# Patient Record
Sex: Female | Born: 2000 | Race: White | Hispanic: No | Marital: Single | Smoking: Former smoker
Health system: Southern US, Community
[De-identification: ages and names within clinical notes are randomized; demographics above are authoritative.]

## PROBLEM LIST (undated history)

## (undated) DIAGNOSIS — F640 Transsexualism: Secondary | ICD-10-CM

## (undated) DIAGNOSIS — F32A Depression, unspecified: Secondary | ICD-10-CM

## (undated) DIAGNOSIS — F909 Attention-deficit hyperactivity disorder, unspecified type: Secondary | ICD-10-CM

## (undated) DIAGNOSIS — E559 Vitamin D deficiency, unspecified: Secondary | ICD-10-CM

## (undated) DIAGNOSIS — F329 Major depressive disorder, single episode, unspecified: Secondary | ICD-10-CM

## (undated) DIAGNOSIS — Z79899 Other long term (current) drug therapy: Secondary | ICD-10-CM

## (undated) DIAGNOSIS — F4010 Social phobia, unspecified: Secondary | ICD-10-CM

## (undated) HISTORY — PX: TYMPANOSTOMY TUBE PLACEMENT: SHX32

---
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
## 2020-11-06 IMAGING — CR CHEST - 2 VIEW
1 series · 2 of 2 positions shown · non-contrast
Comparison: None.

CLINICAL DATA: Chest pain

EXAM:
CHEST - 2 VIEW

[Series 1: dg chest 2 view · 0.14mm/px · 2 of 2 slices shown]
[im 1/2]
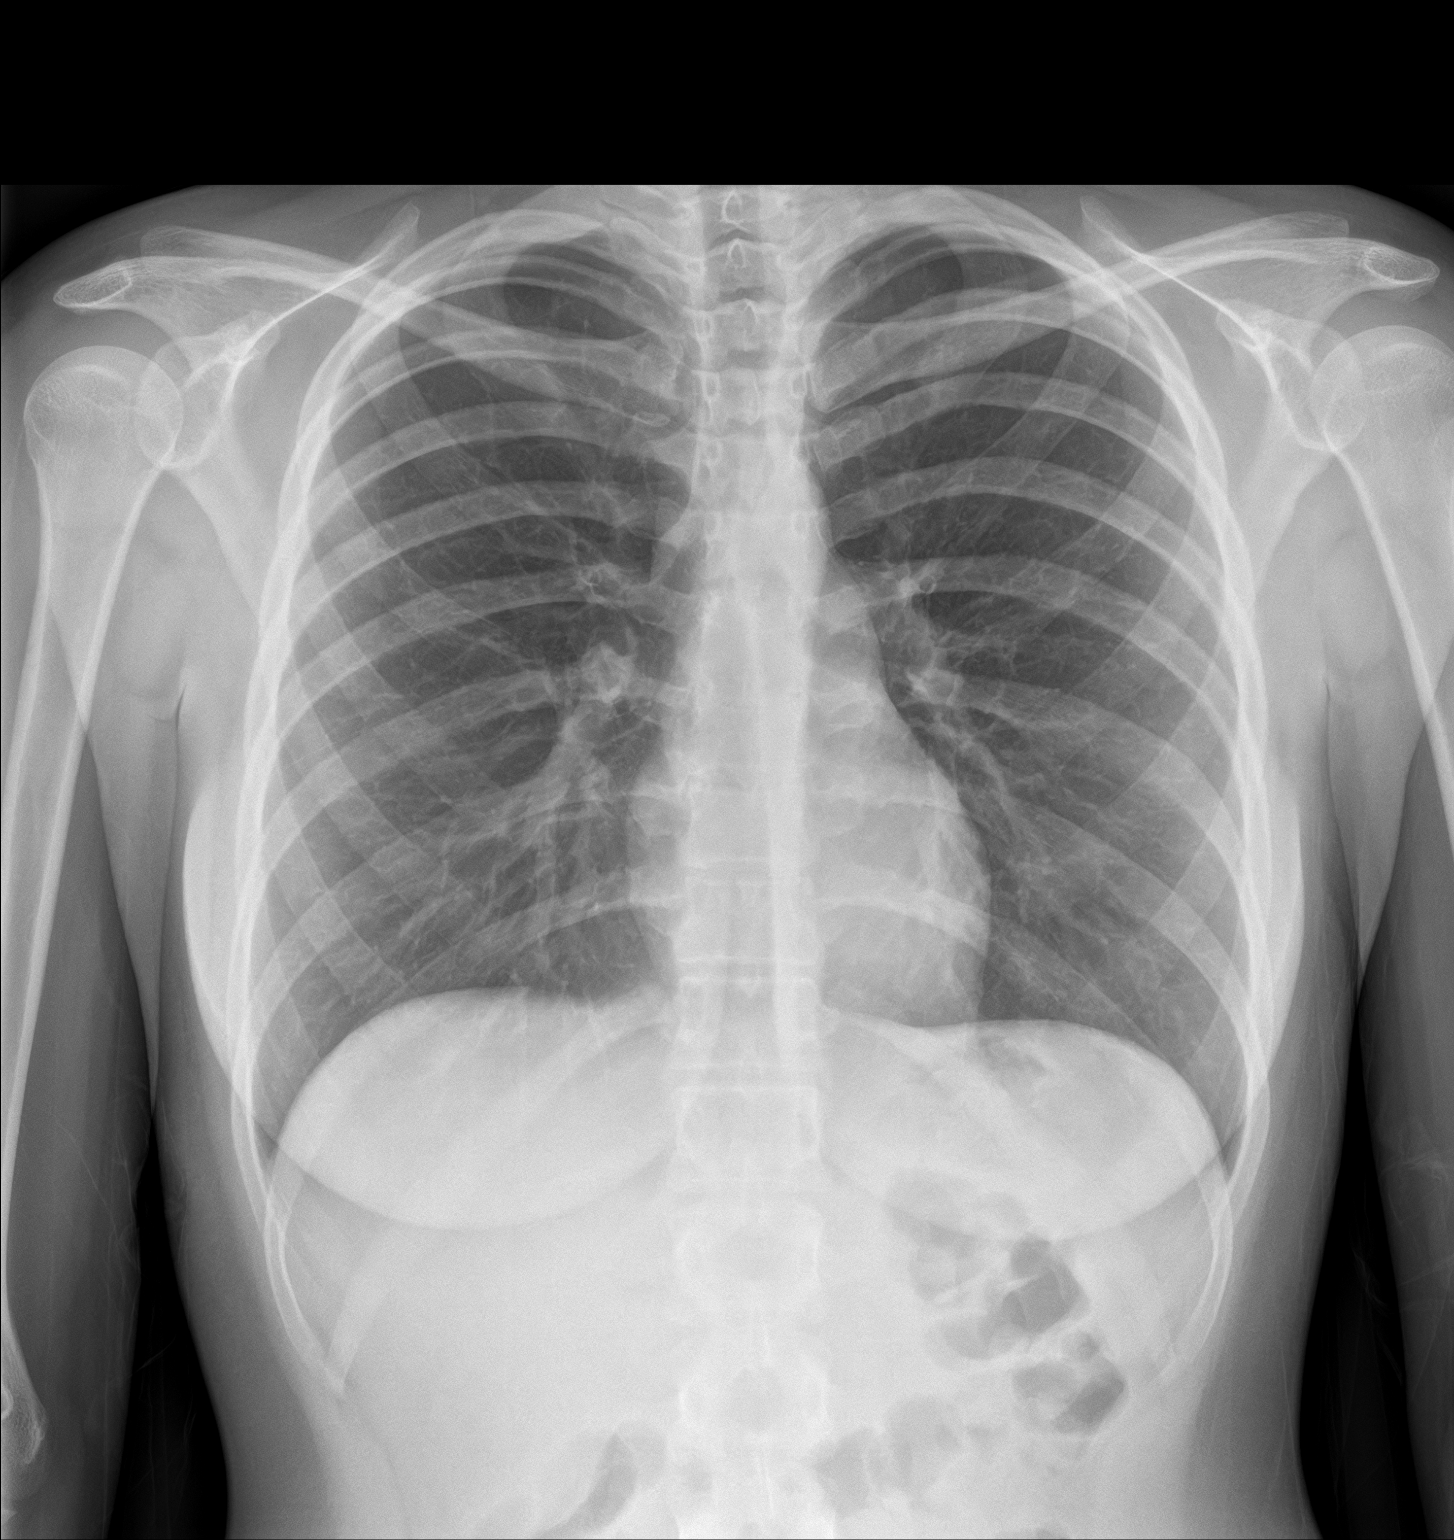
[im 2/2]
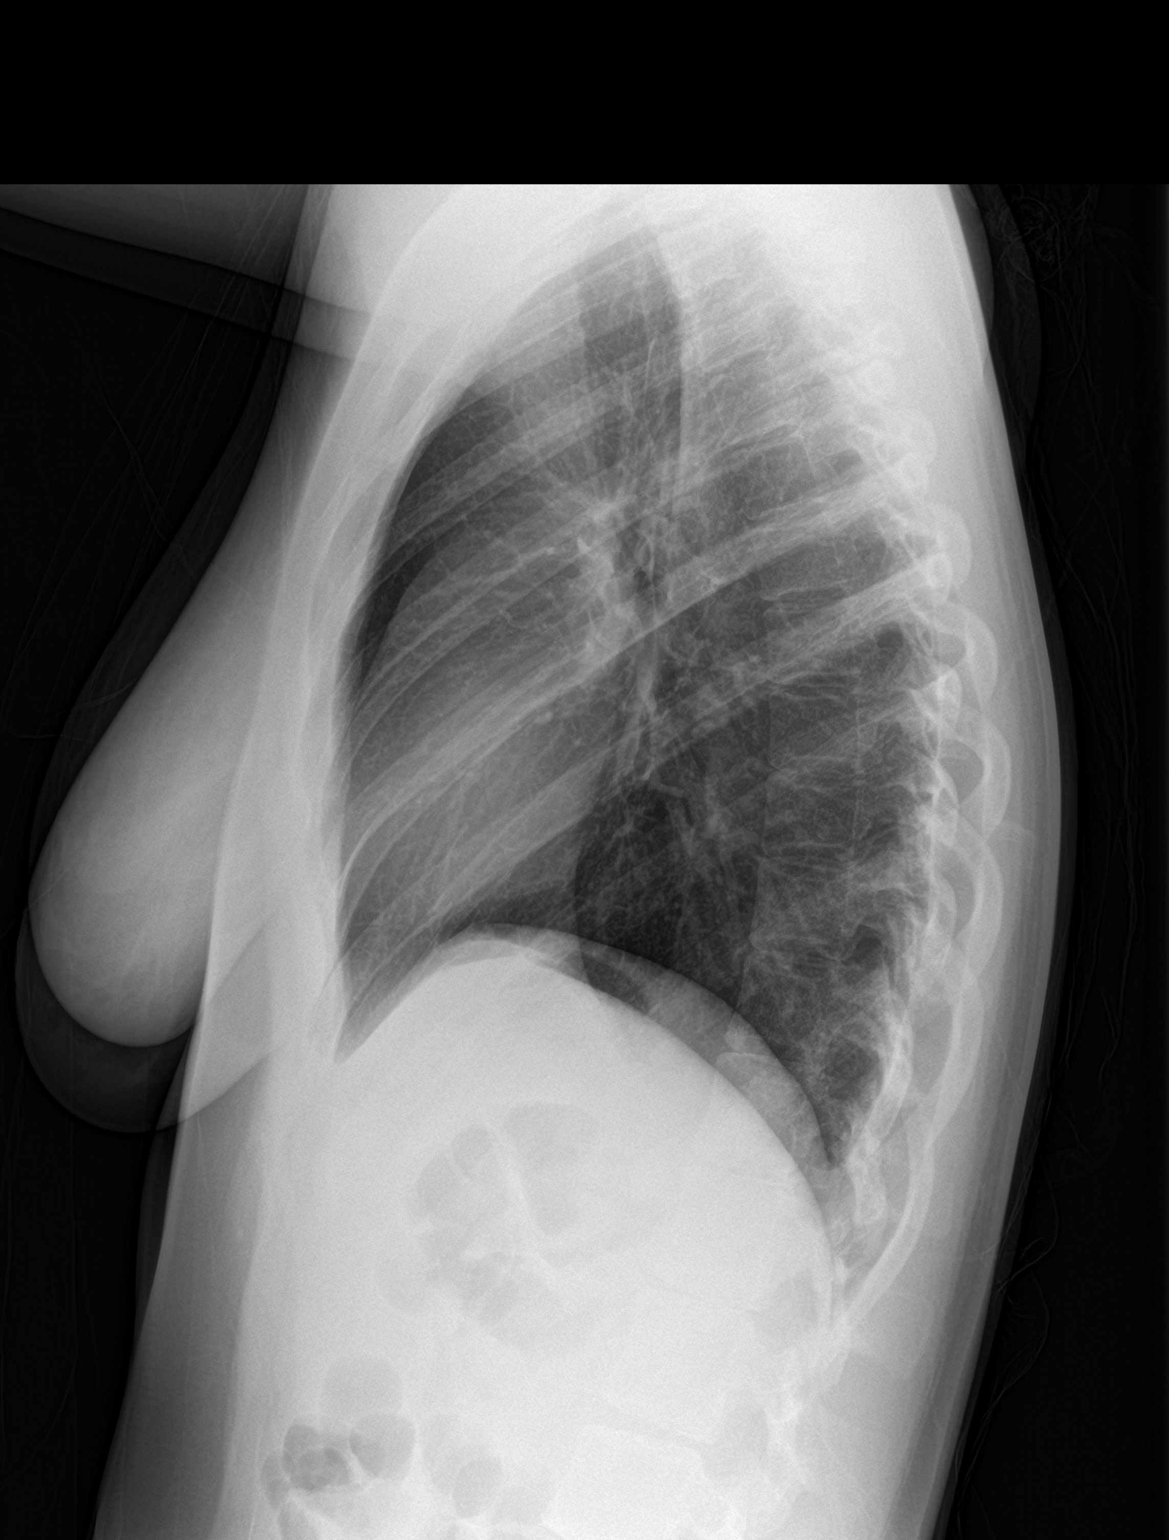

[2 of 2 positions shown; findings below may reference images not displayed]

FINDINGS: The heart size and mediastinal contours are within normal limits.
Both lungs are clear. The visualized skeletal structures are
unremarkable.
IMPRESSION: No acute abnormality of the lungs.  No airspace opacity.

## 2022-02-06 ENCOUNTER — Emergency Department
Admission: EM | Admit: 2022-02-06 | Discharge: 2022-02-06 | Disposition: A | Discharge status: Home / Self Care | Payer: Medicaid Other | Attending: Emergency Medicine | Admitting: Emergency Medicine

## 2022-02-06 ENCOUNTER — Other Ambulatory Visit: Payer: Self-pay

## 2022-02-06 DIAGNOSIS — Z760 Encounter for issue of repeat prescription: Secondary | ICD-10-CM

## 2022-02-06 MED ORDER — OLANZAPINE 5 MG PO TABS: 5.0000 mg | ORAL_TABLET | Freq: Every day | ORAL | 0 refills | Status: DC

## 2022-02-06 NOTE — Discharge Instructions (Addendum)
Your medications were refilled for you.  Please follow-up with your outpatient provider.  Please return for any new, worsening, or change in symptoms or other concerns.  It was a pleasure caring for you today.

## 2022-02-06 NOTE — ED Provider Notes (Signed)
[facility] Provider Note    Event Date/Time   First MD Initiated Contact with Patient 02/06/22 1131     (approximate)   History   Medication Refill   HPI  Christina Mcmillan is a 22 y.o. adult (pronouns he/his) with a past medical history of PTSD, ADHD, depression, social anxiety who presents today for evaluation and request for medication refill.  Patient reports that her last few appointments with [facility] has been canceled and therefore she has not been getting her medication refilled.  She reports that she takes Zyprexa 5 mg nightly, and last dose was last night.  She has not yet missed any doses.  She reports that she feels good on her current dosage.  She denies SI or HI.  She denies auditory or visual hallucinations.  She reports that she feels well-controlled.  She has no physical complaints today.  Patient Active Problem List   Diagnosis Date Noted   PTSD (post-traumatic stress disorder)    Attention deficit hyperactivity disorder (ADHD)    Severe recurrent major depression without psychotic features ([place]) 11/30/2017   MDD (major depressive disorder), severe ([place]) 11/30/2017   Drug overdose 11/29/2017   Hypotension 11/29/2017   Vitamin D deficiency 12/02/2015   Severe episode of recurrent major depressive disorder, without psychotic features ([place]) 11/26/2015   Social anxiety disorder 11/26/2015          Physical Exam   Triage Vital Signs: ED Triage Vitals  Enc Vitals Group     BP 02/06/22 1125 119/68     Pulse Rate 02/06/22 1125 (!) 103     Resp 02/06/22 1125 15     Temp 02/06/22 1125 98.3 F (36.8 C)     Temp Source 02/06/22 1125 Oral     SpO2 02/06/22 1125 98 %     Weight 02/06/22 1126 160 lb (72.6 kg)     Height 02/06/22 1126 5\' 3"  (1.6 m)     Head Circumference --      Peak Flow --      Pain Score 02/06/22 1125 0     Pain Loc --      Pain Edu? --      Excl. in [place]? --     Most recent vital signs: Vitals:   02/06/22 1125  BP: 119/68   Pulse: (!) 103  Resp: 15  Temp: 98.3 F (36.8 C)  SpO2: 98%    Physical Exam Vitals and nursing note reviewed.  Constitutional:      General: Awake and alert. No acute distress.    Appearance: Normal appearance. The patient is normal weight.  HENT:     Head: Normocephalic and atraumatic.     Mouth: Mucous membranes are moist.  Eyes:     General: PERRL. Normal EOMs        Right eye: No discharge.        Left eye: No discharge.     Conjunctiva/sclera: Conjunctivae normal.  Cardiovascular:     Rate and Rhythm: Normal rate and regular rhythm.     Pulses: Normal pulses.  Pulmonary:     Effort: Pulmonary effort is normal. No respiratory distress.     Breath sounds: Normal breath sounds.  Abdominal:     Abdomen is soft. There is no abdominal tenderness. No rebound or guarding. No distention. Musculoskeletal:        General: No swelling. Normal range of motion.     Cervical back: Normal range of motion and neck  supple.  Skin:    General: Skin is warm and dry.     Capillary Refill: Capillary refill takes less than 2 seconds.     Findings: No rash.  Neurological:     Mental Status: The patient is awake and alert.  Psych: Normal eye contact, normal affect, goal oriented, linear speech, does not appear to be responding to internal stimuli     ED Results / Procedures / Treatments   Labs (all labs ordered are listed, but only abnormal results are displayed) Labs Reviewed - No data to display   EKG     RADIOLOGY     PROCEDURES:  Critical Care performed:   Procedures   MEDICATIONS ORDERED IN ED: Medications - No data to display   IMPRESSION / MDM / [place] / ED COURSE  I reviewed the triage vital signs and the nursing notes.   Differential diagnosis includes, but is not limited to, medication refill, depression, anxiety.  I reviewed the patient's chart.  Patient had an office visit on 05/11/2021 for psychotherapy, and at that time she was also  taking Zyprexa.  Patient is awake and alert, hemodynamically stable and afebrile.  Medications were refilled.  He appears to be well stabilized on this medication and reports that he feels well on it.  His next appointment is 2/13.  Recommended close outpatient follow-up and return precautions.  Patient understands and agrees with plan.  Discharged in stable condition.   Patient's presentation is most consistent with acute complicated illness / injury requiring diagnostic workup.   FINAL CLINICAL IMPRESSION(S) / ED DIAGNOSES   Final diagnoses:  Medication refill     Rx / DC Orders   ED Discharge Orders          Ordered    OLANZapine (ZYPREXA) 5 MG tablet  Daily at bedtime        02/06/22 1141             Note:  This document was prepared using Dragon voice recognition software and may include unintentional dictation errors.   Emeline Gins 02/06/22 1214    Rada Hay, MD 02/06/22 [phone]

## 2022-02-06 NOTE — ED Triage Notes (Signed)
Pt to ED ED for med refill (olanzapine/zyprexa) states [facility] has canceled several appts and needs rx's refilled. Next appt at [facility] is 2/13 and ran out yesterday. Usually takes 5mg  ODT once daily. No acute distress, VSS. Pt is taking testosterone since 7/23, identifies as female and uses he/him pronouns. Registration notified to correct chart.

## 2022-02-20 ENCOUNTER — Other Ambulatory Visit: Payer: Self-pay

## 2022-02-20 ENCOUNTER — Emergency Department
Admission: EM | Admit: 2022-02-20 | Discharge: 2022-02-20 | Disposition: A | Discharge status: Home / Self Care | Payer: No Typology Code available for payment source | Attending: Emergency Medicine | Admitting: Emergency Medicine

## 2022-02-20 DIAGNOSIS — Z760 Encounter for issue of repeat prescription: Secondary | ICD-10-CM | POA: Insufficient documentation

## 2022-02-20 DIAGNOSIS — F32A Depression, unspecified: Secondary | ICD-10-CM | POA: Insufficient documentation

## 2022-02-20 MED ORDER — OLANZAPINE 5 MG PO TABS: 5.0000 mg | ORAL_TABLET | Freq: Every day | ORAL | 0 refills | Status: DC

## 2022-02-20 NOTE — ED Provider Notes (Signed)
[facility] Provider Note    Event Date/Time   First MD Initiated Contact with Patient 02/20/22 1052     (approximate)   History   Medication Refill   HPI  Christina Mcmillan is a 22 y.o. adult  past medical history of ADHD, PTSD, major depressive disorder who presents requesting medication refill.  Patient is currently out of her Zyprexa.  Says she takes this because of schizoaffective disorder.  She follows with [facility].  Took last dose last night.  Was seen in the ED on 1/27 and got a 2-week bridge but her appointment with [facility] is not until this Tuesday which is 3 days from now so she needs an extra 3 days.  Her [facility] provider will not give prescription prior to her seeing her.  Otherwise is feeling well without complaint.    Past Medical History:  Diagnosis Date   ADHD (attention deficit hyperactivity disorder)    Depression    Social anxiety disorder 11/26/2015   Vitamin D deficiency 12/02/2015    Patient Active Problem List   Diagnosis Date Noted   PTSD (post-traumatic stress disorder)    Attention deficit hyperactivity disorder (ADHD)    Severe recurrent major depression without psychotic features ([place]) 11/30/2017   MDD (major depressive disorder), severe ([place]) 11/30/2017   Drug overdose 11/29/2017   Hypotension 11/29/2017   Vitamin D deficiency 12/02/2015   Severe episode of recurrent major depressive disorder, without psychotic features ([place]) 11/26/2015   Social anxiety disorder 11/26/2015     Physical Exam  Triage Vital Signs: ED Triage Vitals  Enc Vitals Group     BP 02/20/22 1050 113/74     Pulse Rate 02/20/22 1050 95     Resp 02/20/22 1050 18     Temp 02/20/22 1050 98 F (36.7 C)     Temp Source 02/20/22 1050 Oral     SpO2 02/20/22 1050 96 %     Weight 02/20/22 1051 160 lb 0.9 oz (72.6 kg)     Height 02/20/22 1051 5' 3"$  (1.6 m)     Head Circumference --      Peak Flow --      Pain Score 02/20/22 1051 0     Pain Loc --      Pain Edu? --       Excl. in [place]? --     Most recent vital signs: Vitals:   02/20/22 1050  BP: 113/74  Pulse: 95  Resp: 18  Temp: 98 F (36.7 C)  SpO2: 96%     General: Awake, no distress.  CV:  Good peripheral perfusion.  Resp:  Normal effort.  Abd:  No distention.  Neuro:             Awake, Alert, Oriented x 3  Other:  Normal affect, calm and cooperative   ED Results / Procedures / Treatments  Labs (all labs ordered are listed, but only abnormal results are displayed) Labs Reviewed - No data to display   EKG     RADIOLOGY    PROCEDURES:  Critical Care performed: No  Procedures  The patient is on the cardiac monitor to evaluate for evidence of arrhythmia and/or significant heart rate changes.   MEDICATIONS ORDERED IN ED: Medications - No data to display   IMPRESSION / MDM / [place] / ED COURSE  I reviewed the triage vital signs and the nursing notes.  Patient's presentation is most consistent with acute, uncomplicated illness.  Patient presents requesting medication refill of the Zyprexa.  Takes this for depression/schizoaffective disorder.  Seen in the ED about 2 weeks ago got 2 weeks worth but does not see the [facility] until Tuesday which is 3 days from now on her [place] provider will not refill the prescription prior to this appointment.  Patient has no other complaints today.  Does not appear psychotic.  Last dose was last night.  I will refill 5 days worth to get her to this next appointment.       FINAL CLINICAL IMPRESSION(S) / ED DIAGNOSES   Final diagnoses:  Medication refill     Rx / DC Orders   ED Discharge Orders          Ordered    OLANZapine (ZYPREXA) 5 MG tablet  Daily at bedtime        02/20/22 1109             Note:  This document was prepared using Dragon voice recognition software and may include unintentional dictation errors.   Rada Hay, MD 02/20/22 [phone]

## 2022-02-20 NOTE — ED Triage Notes (Signed)
Pt is here for a medication refill, her Zyprexa and Olanzapine. Pt states she is being seen at [facility].

## 2022-04-25 ENCOUNTER — Other Ambulatory Visit: Payer: Self-pay

## 2022-04-25 ENCOUNTER — Emergency Department
Admission: EM | Admit: 2022-04-25 | Discharge: 2022-04-25 | Disposition: A | Discharge status: Home / Self Care | Payer: Medicaid Other | Attending: Emergency Medicine | Admitting: Emergency Medicine

## 2022-04-25 DIAGNOSIS — Z760 Encounter for issue of repeat prescription: Secondary | ICD-10-CM | POA: Diagnosis present

## 2022-04-25 MED ORDER — OLANZAPINE 5 MG PO TABS: 5.0000 mg | ORAL_TABLET | Freq: Every day | ORAL | 0 refills | Status: DC

## 2022-04-25 NOTE — Discharge Instructions (Addendum)
Please follow up with [facility] as scheduled. Return for new, worsening, or change in symptoms or other concerns.

## 2022-04-25 NOTE — ED Triage Notes (Signed)
Pt needs refill of Zyprexa. Ran out yesterday. Usually gets prescribed by [facility]

## 2022-04-25 NOTE — ED Provider Notes (Signed)
[facility] Provider Note    Event Date/Time   First MD Initiated Contact with Patient 04/25/22 1137     (approximate)   History   Medication Refill   HPI  Christina Mcmillan is a 22 y.o. adult who presents today for refill of Zyprexa.  Patient reports that he normally gets these filled at [facility], however they "never give me enough to get through until my next appointment."  He reports that his next appointment is this week.  He has not missed any doses. He take  QHS.  He reports that he feels well-controlled with medication.  He denies SI/HI/depression/auditory or visual hallucinations, or any other physical or psychiatric complaints today.  Patient Active Problem List   Diagnosis Date Noted   PTSD (post-traumatic stress disorder)    Attention deficit hyperactivity disorder (ADHD)    Severe recurrent major depression without psychotic features 11/30/2017   MDD (major depressive disorder), severe 11/30/2017   Drug overdose 11/29/2017   Hypotension 11/29/2017   Vitamin D deficiency 12/02/2015   Severe episode of recurrent major depressive disorder, without psychotic features 11/26/2015   Social anxiety disorder 11/26/2015          Physical Exam   Triage Vital Signs: ED Triage Vitals  Enc Vitals Group     BP 04/25/22 1139 117/75     Pulse Rate 04/25/22 1139 89     Resp 04/25/22 1139 17     Temp 04/25/22 1139 97.9 F (36.6 C)     Temp Source 04/25/22 1139 Oral     SpO2 04/25/22 1139 97 %     Weight 04/25/22 1133 158 lb 11.7 oz (72 kg)     Height 04/25/22 1133  (1.6 m)     Head Circumference --      Peak Flow --      Pain Score 04/25/22 1132 0     Pain Loc --      Pain Edu? --      Excl. in [place]? --     Most recent vital signs: Vitals:   04/25/22 1139  BP: 117/75  Pulse: 89  Resp: 17  Temp: 97.9 F (36.6 C)  SpO2: 97%    Physical Exam Vitals and nursing note reviewed.  Constitutional:      General: Awake and alert. No acute  distress.    Appearance: Normal appearance. The patient is normal weight.  HENT:     Head: Normocephalic and atraumatic.     Mouth: Mucous membranes are moist.  Eyes:     General: PERRL. Normal EOMs        Right eye: No discharge.        Left eye: No discharge.     Conjunctiva/sclera: Conjunctivae normal.  Cardiovascular:     Rate and Rhythm: Normal rate and regular rhythm.     Pulses: Normal pulses.  Pulmonary:     Effort: Pulmonary effort is normal. No respiratory distress.     Breath sounds: Normal breath sounds.  Abdominal:     Abdomen is soft. There is no abdominal tenderness. No rebound or guarding. No distention. Musculoskeletal:        General: No swelling. Normal range of motion.     Cervical back: Normal range of motion and neck supple.  Skin:    General: Skin is warm and dry.     Capillary Refill: Capillary refill takes less than 2 seconds.     Findings: No rash.  Neurological:  Mental Status: The patient is awake and alert.   Psych: Calm, cooperative, linear, goal oriented, normal eye contact, normal affect, does not appear to be responding to internal stimuli   ED Results / Procedures / Treatments   Labs (all labs ordered are listed, but only abnormal results are displayed) Labs Reviewed - No data to display   EKG     RADIOLOGY     PROCEDURES:  Critical Care performed:   Procedures   MEDICATIONS ORDERED IN ED: Medications - No data to display   IMPRESSION / MDM / ASSESSMENT AND PLAN / ED COURSE  I reviewed the triage vital signs and the nursing notes.   Differential diagnosis includes, but is not limited to, medication refill, schizoaffective disorder.  I reviewed the patient's chart.  Patient has been seen in this emergency department twice this year with the same complaint.    He reports that his next appointment is in 3 days.  Patient was given a weeks worth in case of delayed appointment.  He is awake and alert, nontoxic in  appearance, and has no other complaints today.  He has not missed any doses.  We discussed strict return precautions and the importance of close outpatient follow-up.  Patient understands and agrees with plan.  Discharged in stable condition.   Patient's presentation is most consistent with acute, uncomplicated illness.    FINAL CLINICAL IMPRESSION(S) / ED DIAGNOSES   Final diagnoses:  Medication refill     Rx / DC Orders   ED Discharge Orders          Ordered    OLANZapine (ZYPREXA) 5 MG tablet  Daily at bedtime        04/25/22 1142             Note:  This document was prepared using Dragon voice recognition software and may include unintentional dictation errors.   Keturah Shavers 04/25/22 1241    Jene Every, MD 04/25/22 1328

## 2022-05-16 ENCOUNTER — Other Ambulatory Visit: Payer: Self-pay

## 2022-05-16 ENCOUNTER — Emergency Department
Admission: EM | Admit: 2022-05-16 | Discharge: 2022-05-16 | Disposition: A | Discharge status: Home / Self Care | Payer: Medicaid Other | Attending: Emergency Medicine | Admitting: Emergency Medicine

## 2022-05-16 DIAGNOSIS — L03032 Cellulitis of left toe: Secondary | ICD-10-CM | POA: Insufficient documentation

## 2022-05-16 HISTORY — DX: Transsexualism: F64.0

## 2022-05-16 HISTORY — DX: Other long term (current) drug therapy: Z79.899

## 2022-05-16 MED ORDER — SULFAMETHOXAZOLE-TRIMETHOPRIM 800-160 MG PO TABS
1.0000 | ORAL_TABLET | Freq: Once | ORAL | Status: AC
  Administered 2022-05-16: 1 via ORAL
  Filled 2022-05-16: qty 1

## 2022-05-16 MED ORDER — SULFAMETHOXAZOLE-TRIMETHOPRIM 800-160 MG PO TABS: 1.0000 | ORAL_TABLET | Freq: Two times a day (BID) | ORAL | 0 refills | Status: DC

## 2022-05-16 NOTE — ED Provider Notes (Signed)
[facility] Provider Note  Patient Contact: 3:56 PM (approximate)   History   ingrown toenail infx   HPI  Christina Mcmillan is a 22 y.o. adult who presents the emergency department complaining of possible ingrown toenail and possible infection of the great toe left foot.  Patient has no     Physical Exam   Triage Vital Signs: ED Triage Vitals  Enc Vitals Group     BP 05/16/22 1251 121/67     Pulse Rate 05/16/22 1251 (!) 115     Resp 05/16/22 1251 16     Temp 05/16/22 1251 98 F (36.7 C)     Temp Source 05/16/22 1251 Oral     SpO2 05/16/22 1251 97 %     Weight --      Height --      Head Circumference --      Peak Flow --      Pain Score 05/16/22 1248 10     Pain Loc --      Pain Edu? --      Excl. in [place]? --     Most recent vital signs: Vitals:   05/16/22 1251  BP: 121/67  Pulse: (!) 115  Resp: 16  Temp: 98 F (36.7 C)  SpO2: 97%     General: Alert and in no acute distress. Cardiovascular:  Good peripheral perfusion Respiratory: Normal respiratory effort without tachypnea or retractions. Lungs CTAB.  Musculoskeletal: Full range of motion to all extremities.  Visualization of great toe left foot reveals erythema with draining purulence along the lateral nail edge. Neurologic:  No gross focal neurologic deficits are appreciated.  Skin:   No rash noted Other:   ED Results / Procedures / Treatments   Labs (all labs ordered are listed, but only abnormal results are displayed) Labs Reviewed - No data to display   EKG     RADIOLOGY    No results found.  PROCEDURES:  Critical Care performed: No  Procedures   MEDICATIONS ORDERED IN ED: Medications  sulfamethoxazole-trimethoprim (BACTRIM DS) 800-160 MG per tablet 1 tablet (has no administration in time range)     IMPRESSION / MDM / ASSESSMENT AND PLAN / ED COURSE  I reviewed the triage vital signs and the nursing notes.                                  Differential diagnosis includes, but is not limited to, paronychia, cellulitis, ingrown nail,  Patient's presentation is most consistent with acute presentation with potential threat to life or bodily function.   Patient's diagnosis is consistent with paronychia.  Patient presents emergency department complaining of pain and drainage from the left great toe.  No known injury.  Patient states that this has been developing over the last week to 2 weeks.  No other complaints.  Findings on exam are consistent with paronychia.  Antibiotics prescribed.  Follow-up with primary care or podiatry as needed.  Prescription for Bactrim.  Patient is given ED precautions to return to the ED for any worsening or new symptoms.     FINAL CLINICAL IMPRESSION(S) / ED DIAGNOSES   Final diagnoses:  Paronychia of great toe of left foot     Rx / DC Orders   ED Discharge Orders          Ordered    sulfamethoxazole-trimethoprim (BACTRIM DS) 800-160 MG tablet  2 times daily  05/16/22 1620             Note:  This document was prepared using Dragon voice recognition software and may include unintentional dictation errors.   Racheal Patches, PA-C 05/16/22 1627    Concha Se, MD 05/16/22 [phone]

## 2022-05-16 NOTE — ED Triage Notes (Signed)
Pt to Ed for possible infection to L great toenail area where pt has ingrown toenail. States blood and purulent drainage since 3 days. He/him pronouns.

## 2022-07-04 ENCOUNTER — Emergency Department
Admission: EM | Admit: 2022-07-04 | Discharge: 2022-07-04 | Disposition: A | Discharge status: Home / Self Care | Payer: Medicaid Other | Attending: Emergency Medicine | Admitting: Emergency Medicine

## 2022-07-04 ENCOUNTER — Other Ambulatory Visit: Payer: Self-pay

## 2022-07-04 DIAGNOSIS — L03031 Cellulitis of right toe: Secondary | ICD-10-CM | POA: Insufficient documentation

## 2022-07-04 DIAGNOSIS — M79674 Pain in right toe(s): Secondary | ICD-10-CM | POA: Diagnosis present

## 2022-07-04 DIAGNOSIS — L03039 Cellulitis of unspecified toe: Secondary | ICD-10-CM

## 2022-07-04 MED ORDER — SULFAMETHOXAZOLE-TRIMETHOPRIM 800-160 MG PO TABS: 1.0000 | ORAL_TABLET | Freq: Two times a day (BID) | ORAL | 0 refills | Status: DC

## 2022-07-04 MED ORDER — BACITRACIN ZINC 500 UNIT/GM EX OINT
TOPICAL_OINTMENT | Freq: Two times a day (BID) | CUTANEOUS | Status: DC
  Filled 2022-07-04: qty 0.9

## 2022-07-04 NOTE — ED Notes (Signed)
Patient declined discharge vital signs. 

## 2022-07-04 NOTE — ED Provider Notes (Signed)
[facility] Emergency Department Provider Note     Event Date/Time   First MD Initiated Contact with Patient 07/04/22 1133     (approximate)   History   Nail Problem   HPI  Christina Mcmillan is a 22 y.o. adult who presents to the ED for right great toe pain x 1 month.  Patient was diagnosed with a paronychia a month ago where he was treated with antibiotics. Symptoms resolved but have recently returned. Endorses white drainage and poor hygiene to toe. Pain score 0/10 an 7/10 with touch. Patient was instructed to follow up with podiatry but was unable to make an appointment. No injury or trauma.  Denies fever and numbness.    Physical Exam   Triage Vital Signs: ED Triage Vitals [07/04/22 1120]  Enc Vitals Group     BP 115/69     Pulse Rate (!) 121     Resp 18     Temp 98.3 F (36.8 C)     Temp src      SpO2 97 %     Weight 160 lb (72.6 kg)     Height 5\' 3"  (1.6 m)     Head Circumference      Peak Flow      Pain Score 0     Pain Loc      Pain Edu?      Excl. in [place]?     Most recent vital signs: Vitals:   07/04/22 1120  BP: 115/69  Pulse: (!) 121  Resp: 18  Temp: 98.3 F (36.8 C)  SpO2: 97%    General Awake, no distress.  Well-appearing. HEENT NCAT. PERRL. EOMI. No rhinorrhea. Mucous membranes are moist.  CV:  Good peripheral perfusion.  RESP:  Normal effort.  ABD:  No distention.  Other:  1 cm erythematous and edematous lesion on the lateral groove of right big toe.  Brown discharge in [place] upon removal.   ED Results / Procedures / Treatments   Labs (all labs ordered are listed, but only abnormal results are displayed) Labs Reviewed - No data to display  No results found.   PROCEDURES:  Critical Care performed: No  Procedures   MEDICATIONS ORDERED IN ED: Medications  bacitracin ointment ( Topical Given 07/04/22 1218)     IMPRESSION / MDM / ASSESSMENT AND PLAN / ED COURSE  I reviewed the triage vital signs and the  nursing notes.                               22 y.o. adult presents to the emergency department for evaluation and treatment of a skin lesion on the lateral groove of right big toe clinically consistent with paronychia. See HPI for further details.   Given presentation of lesion on lateral groove of big toe patient will be given antibiotics for infection.  I extensively cleaned and irrigated the lesion with chlorhexidine wash and saline, applied bacitracin ointment following a nonadherent gauze wrap for protection.  This was demonstrated for patient education and at home Patient is in satisfactory and stable condition for discharge and outpatient follow up.  We briefly discussed of at home wound care.  I encouraged frequent dressing changes throughout the day.  Patient will be discharged home with prescriptions for 10 day course of Bactrim. Patient is to follow up with podiatry in 2 days for further evaluation.  A list of local primary  providers is provided for patient to establish primary care. patient is given ED precautions to return to the ED for any worsening or new symptoms. Patient verbalizes understanding. All questions and concerns were addressed during ED visit.    Patient's presentation is most consistent with acute illness / injury with system symptoms.  FINAL CLINICAL IMPRESSION(S) / ED DIAGNOSES   Final diagnoses:  Paronychia of great toe     Rx / DC Orders   ED Discharge Orders          Ordered    sulfamethoxazole-trimethoprim (BACTRIM DS) 800-160 MG tablet  2 times daily,   Status:  Discontinued        07/04/22 1231    sulfamethoxazole-trimethoprim (BACTRIM DS) 800-160 MG tablet  2 times daily        07/04/22 1244             Note:  This document was prepared using Dragon voice recognition software and may include unintentional dictation errors.    Romeo Apple, Brandonlee Navis A, PA-C 07/04/22 1407    Georga Hacking, MD 07/04/22 1600

## 2022-07-04 NOTE — Discharge Instructions (Addendum)
Please go to the following website to schedule new (and existing) patient appointments:   [email or web address]   The following is a list of primary care offices in the area who are accepting new patients at this time.  Please reach out to one of them directly and let them know you would like to schedule an appointment to follow up on an Emergency Department visit, and/or to establish a new primary care provider (PCP).  There are likely other primary care clinics in the are who are accepting new patients, but this is an excellent place to start:  [facility] Lead physician: Dr Angela Bacigalupo [address] [place], [place] [address] [phone]  [facility] Lead Physician: Dr Krichna Sowles [address], , [place] [address] [phone]  [facility]  Lead Physician: Dr Megan Johnson [address], [place], [place] [address] [phone]  [facility] [place] [facility] Lead Physician: Dr Alex Karamalegos [address], [place], [place] [address] [phone]  [facility] [place] Lead Physician: Dr Laura Berglund [address], [place], [place] [address] [phone]   

## 2022-07-04 NOTE — ED Triage Notes (Signed)
Pt states coming in due to an infection on his big toe to the right foot. Pt states it first started a month ago, got better with antibiotics, but it has now gotten worse again.

## 2022-07-05 ENCOUNTER — Telehealth: Payer: Self-pay | Admitting: Podiatry

## 2022-07-05 NOTE — Telephone Encounter (Signed)
Called pt to see if they wanted to sched a f/u appt w/ [facility]. Pt stated that they would cb

## 2022-07-22 ENCOUNTER — Ambulatory Visit: Payer: MEDICAID | Admitting: Podiatry

## 2022-07-22 DIAGNOSIS — L600 Ingrowing nail: Secondary | ICD-10-CM

## 2022-07-22 NOTE — Progress Notes (Signed)
Subjective:  Patient ID: Christina Mcmillan, adult    DOB: Apr 30, 2000,  MRN: [id number]  Chief Complaint  Patient presents with   Ingrown Toenail    Bilateral hallux ingrown     22 y.o. adult presents with the above complaint.  Patient presents with left hallux lateral border ingrown painful to touch is progressive and worse hurts with ambulation worse with pressure pain scale 7 out of 10 dull achy in nature He would like to have it removed.  He has not seen anyone as prior to seeing me   Review of Systems: Negative except as noted in the HPI. Denies N/V/F/Ch.  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 Mix with 5 mL of Phenergan DM for swish and swallow., Disp: 100 mL, Rfl: 0   OLANZapine (ZYPREXA) 5 MG tablet, Take 1 tablet (5 mg total) by mouth at bedtime for 5 days., Disp: 5 tablet, Rfl: 0   OLANZapine (ZYPREXA) 5 MG tablet, Take 1 tablet (5 mg total) by mouth at bedtime for 7 days., Disp: 7 tablet, Rfl: 0   promethazine-dextromethorphan (PROMETHAZINE-DM) 6.25-15 MG/5ML syrup, Take 5 mLs by mouth 4 (four) times daily as needed for cough. Mix with 5 mL of viscous lidocaine for swish and swallow., Disp: 118 mL, Rfl: 0   sulfamethoxazole-trimethoprim (BACTRIM DS) 800-160 MG tablet, Take 1 tablet by mouth 2 (two) times daily., Disp: 20 tablet, Rfl: 0   Vitamin D, Ergocalciferol, (DRISDOL) 1.25 MG (50000 UT) CAPS capsule, , Disp: , Rfl:   Social History   Tobacco Use  Smoking Status Former  Smokeless Tobacco Former    Allergies   Allergen Reactions   Fruit & Vegetable Daily [Nutritional Supplements] Itching    Reports that she has an itching sensation when she eats "raw" fruits and vega tables. No severe reactions reported.   Objective:  There were no vitals filed for this visit. There is no height or weight on file to calculate BMI. Constitutional Well developed. Well nourished.  Vascular Dorsalis pedis pulses palpable bilaterally. Posterior tibial pulses palpable bilaterally. Capillary refill normal to all digits.  No cyanosis or clubbing noted. Pedal hair growth normal.  Neurologic Normal speech. Oriented to person, place, and time. Epicritic sensation to light touch grossly present bilaterally.  Dermatologic Painful ingrowing nail at lateral nail borders of the hallux nail left. No other open wounds. No skin lesions.  Orthopedic: Normal joint ROM without pain or crepitus bilaterally. No visible deformities. No bony tenderness.   Radiographs: None Assessment:   1. Ingrown left big toenail    Plan:  Patient was evaluated and treated and all questions answered.  Ingrown Nail, left -Patient elects to proceed with minor surgery to remove ingrown toenail removal today. Consent reviewed and signed by patient. -Ingrown nail excised. See procedure note. -Educated on post-procedure care including soaking. Written instructions provided and reviewed. -Patient to follow up in 2 weeks for nail check.  Procedure: Excision of Ingrown Toenail Location: Left 1st toe lateral nail borders. Anesthesia:  Lidocaine 1% plain; 1.5 mL and Marcaine 0.5% plain; 1.5 mL, digital block. Skin Prep: Betadine. Dressing: Silvadene; telfa; dry, sterile, compression dressing. Technique: Following skin prep, the toe was exsanguinated and a tourniquet was secured at the base of the toe. The affected nail border was freed, split with a nail splitter, and excised. Chemical matrixectomy was then performed with phenol and irrigated out  with alcohol. The tourniquet was then removed and sterile dressing applied. Disposition: Patient tolerated procedure well. Patient to return in 2 weeks for follow-up.   No follow-ups on file.

## 2022-07-22 NOTE — Patient Instructions (Signed)

## 2022-10-02 ENCOUNTER — Other Ambulatory Visit: Payer: Self-pay

## 2022-10-02 ENCOUNTER — Emergency Department
Admission: EM | Admit: 2022-10-02 | Discharge: 2022-10-02 | Disposition: A | Discharge status: Home / Self Care | Payer: MEDICAID | Attending: Emergency Medicine | Admitting: Emergency Medicine

## 2022-10-02 DIAGNOSIS — R300 Dysuria: Secondary | ICD-10-CM | POA: Diagnosis present

## 2022-10-02 DIAGNOSIS — N3090 Cystitis, unspecified without hematuria: Secondary | ICD-10-CM | POA: Insufficient documentation

## 2022-10-02 LAB — WET PREP, GENITAL
Clue Cells Wet Prep HPF POC: NONE SEEN
Sperm: NONE SEEN
Trich, Wet Prep: NONE SEEN
WBC, Wet Prep HPF POC: <10 (ref <10)
Yeast Wet Prep HPF POC: NONE SEEN

## 2022-10-02 LAB — URINALYSIS, ROUTINE W REFLEX MICROSCOPIC

## 2022-10-02 MED ORDER — IBUPROFEN 600 MG PO TABS: 600.0000 mg | ORAL_TABLET | Freq: Four times a day (QID) | ORAL | 0 refills | Status: DC | PRN

## 2022-10-02 NOTE — ED Provider Notes (Signed)
[facility] Provider Note    Event Date/Time   First MD Initiated Contact with Patient 10/02/22 1142     (approximate)   History   Flank Pain   HPI  LEMON SUPPA is a 22 y.o. adult   presents to the ED with urinary symptoms.  Patient reports dysuria and frequency for approximately 1 week but no nausea, vomiting or fever.  Patient also reports that she has had urinary symptoms for 6 years without a diagnosis.  Patient is currently taking cranberry tablets for her symptoms.      Physical Exam   Triage Vital Signs: ED Triage Vitals  Encounter Vitals Group     BP 10/02/22 1135 109/73     Systolic BP Percentile --      Diastolic BP Percentile --      Pulse Rate 10/02/22 1135 (!) 112     Resp 10/02/22 1135 18     Temp 10/02/22 1135 98.2 F (36.8 C)     Temp Source 10/02/22 1135 Oral     SpO2 10/02/22 1135 95 %     Weight 10/02/22 1136 150 lb (68 kg)     Height 10/02/22 1136 5\' 3"  (1.6 m)     Head Circumference --      Peak Flow --      Pain Score 10/02/22 1135 9     Pain Loc --      Pain Education --      Exclude from Growth Chart --     Most recent vital signs: Vitals:   10/02/22 1135 10/02/22 1300  BP: 109/73   Pulse: (!) 112   Resp: 18   Temp: 98.2 F (36.8 C) 98.4 F (36.9 C)  SpO2: 95%      General: Awake, no distress.  Alert. CV:  Good peripheral perfusion.  Resp:  Normal effort.  Clear bilaterally. Abd:  No distention.  No flank pain.  Other:     ED Results / Procedures / Treatments   Labs (all labs ordered are listed, but only abnormal results are displayed) Labs Reviewed  URINALYSIS, ROUTINE W REFLEX MICROSCOPIC - Abnormal; Notable for the following components:      Result Value   Color, Urine RED (*)    APPearance CLEAR (*)    Glucose, UA   (*)    Value: TEST NOT REPORTED DUE TO COLOR INTERFERENCE OF URINE PIGMENT   Hgb urine dipstick   (*)    Value: TEST NOT REPORTED DUE TO COLOR INTERFERENCE OF URINE PIGMENT    Bilirubin Urine   (*)    Value: TEST NOT REPORTED DUE TO COLOR INTERFERENCE OF URINE PIGMENT   Ketones, ur   (*)    Value: TEST NOT REPORTED DUE TO COLOR INTERFERENCE OF URINE PIGMENT   Protein, ur   (*)    Value: TEST NOT REPORTED DUE TO COLOR INTERFERENCE OF URINE PIGMENT   Nitrite   (*)    Value: TEST NOT REPORTED DUE TO COLOR INTERFERENCE OF URINE PIGMENT   Leukocytes,Ua   (*)    Value: TEST NOT REPORTED DUE TO COLOR INTERFERENCE OF URINE PIGMENT   Bacteria, UA RARE (*)    All other components within normal limits  WET PREP, GENITAL     PROCEDURES:  Critical Care performed:   Procedures   MEDICATIONS ORDERED IN ED: Medications - No data to display   IMPRESSION / MDM / ASSESSMENT AND PLAN / ED COURSE  I reviewed  the triage vital signs and the nursing notes.   Differential diagnosis includes, but is not limited to, cystitis, urinary tract infection, interstitial cystitis considered, urolithiasis, vaginitis bacterial versus yeast.  22 year old female presents to the ED with a 6-year history of dysuria for which there is not been a diagnosis made.  Patient has been taking cranberry tablets frequently for symptom relief.  Urinalysis showed rare bacteria and 6-10 WBCs.  Wet prep was negative for trichomonas, clue cells, yeast or WBCs.  I discussed referral to a urologist for her long-term urological symptoms which patient agrees to comply.  A prescription for ibuprofen was sent to the pharmacy and patient plans to continue with her cranberry tablets as needed.      Patient's presentation is most consistent with acute complicated illness / injury requiring diagnostic workup.  FINAL CLINICAL IMPRESSION(S) / ED DIAGNOSES   Final diagnoses:  Cystitis     Rx / DC Orders   ED Discharge Orders          Ordered    ibuprofen (ADVIL) 600 MG tablet  Every 6 hours PRN        10/02/22 1254             Note:  This document was prepared using Dragon voice recognition  software and may include unintentional dictation errors.   Tommi Rumps, PA-C 10/02/22 1407    Jene Every, MD 10/02/22 1435

## 2022-10-02 NOTE — Discharge Instructions (Addendum)
Call make an appointment with a urologist.  There are 2 offices listed on your discharge papers.  Ibuprofen was sent to the pharmacy to take as needed with food.  Increase fluids to stay hydrated.

## 2022-10-02 NOTE — ED Triage Notes (Signed)
Pt to ED c/o bilat flank pain x 1 day, reports UTI symptoms x 1 week, reports burning with urination, frequency. Reports hx of UTI, symptoms feel the same.

## 2022-10-02 NOTE — ED Notes (Signed)
Patient has specimen container and states after drinking some water, he will provide a specimen. Significant other at bedside.

## 2022-10-02 NOTE — ED Notes (Signed)
Pt states they are on their period at this time.

## 2022-10-13 ENCOUNTER — Encounter: Payer: Self-pay | Admitting: Obstetrics

## 2022-10-13 ENCOUNTER — Ambulatory Visit: Payer: MEDICAID | Admitting: Obstetrics

## 2022-10-13 ENCOUNTER — Other Ambulatory Visit (HOSPITAL_COMMUNITY)
Admission: RE | Admit: 2022-10-13 | Discharge: 2022-10-13 | Disposition: A | Discharge status: Home / Self Care | Payer: MEDICAID | Source: Ambulatory Visit | Attending: Obstetrics | Admitting: Obstetrics

## 2022-10-13 VITALS — BP 100/70 | Ht 63.0 in | Wt 149.0 lb

## 2022-10-13 DIAGNOSIS — R300 Dysuria: Secondary | ICD-10-CM

## 2022-10-13 DIAGNOSIS — N898 Other specified noninflammatory disorders of vagina: Secondary | ICD-10-CM | POA: Insufficient documentation

## 2022-10-13 DIAGNOSIS — Z113 Encounter for screening for infections with a predominantly sexual mode of transmission: Secondary | ICD-10-CM

## 2022-10-13 DIAGNOSIS — Z124 Encounter for screening for malignant neoplasm of cervix: Secondary | ICD-10-CM | POA: Insufficient documentation

## 2022-10-13 LAB — POCT URINALYSIS DIPSTICK
Bilirubin, UA: NEGATIVE
Glucose, UA: NEGATIVE
Ketones, UA: NEGATIVE
Nitrite, UA: NEGATIVE
Protein, UA: NEGATIVE
Spec Grav, UA: 1.01 (ref 1.010–1.025)
Urobilinogen, UA: 1 U/dL
pH, UA: 5.5 (ref 5.0–8.0)

## 2022-10-13 NOTE — Progress Notes (Signed)
    GYNECOLOGY PROGRESS NOTE  Subjective:    Patient ID: Christina Mcmillan, adult    DOB: 02-15-2000, 22 y.o.   MRN: [id number]  HPI  Patient is a 22 y.o. 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 He states h/o interstitial cystitis since 22yo and urine tests often come back "clean" but he continues to have dysuria. Pt thinks the opening of his vagina looks "puckered out" and is worried his uterus is "falling out." The itching has been since being off his testosterone medication for more than 2 mos. [facility], but hasn't been taking that for the past 2 mos either.   The following portions of the patient's history were reviewed and updated as appropriate: allergies, current medications, past family history, past medical history, past social history, past surgical history, and problem list.  Review of Systems Pertinent items are noted in HPI.   Objective:   Blood pressure 100/70, height 5\' 3"  (1.6 m), weight 149 lb (67.6 kg), last menstrual period 09/29/2022. Body mass index is 26.39 kg/m. General appearance: alert and cooperative Abdomen: soft, non-tender; bowel sounds normal; no masses,  no organomegaly Pelvic: cervix normal in appearance, external genitalia normal, no adnexal masses or tenderness, no cervical motion tenderness, uterus normal size, shape, and consistency, and vagina normal without discharge. Pap obtained.  Extremities: extremities normal, atraumatic, no cyanosis or edema Neurologic: Grossly normal  Assessment:   1. Dysuria   2. Vaginal itching   3. Screening examination for STI   4. Cervical cancer screening     Plan:  UA in office with +1 LE and trace BLD, sent for culture and shared decision made to defer treatment until culture results back. Pt also due for pap, no hx of prior, and requesting full STI screening today, will come back for blood draw. Reassurance given re: pt's anatomy, all completely within  normal variance and no evidence of any prolapse. All questions today answered. Follow up yearly for annual exam, sooner prn.   Julieanne Manson, DO [facility]

## 2022-10-15 LAB — URINE CULTURE

## 2022-10-15 NOTE — Progress Notes (Signed)
Please tell Joelene Millin his culture showed no UTI

## 2022-10-16 ENCOUNTER — Encounter: Payer: Self-pay | Admitting: Obstetrics

## 2022-10-18 ENCOUNTER — Other Ambulatory Visit: Payer: Self-pay

## 2022-10-18 DIAGNOSIS — B379 Candidiasis, unspecified: Secondary | ICD-10-CM

## 2022-10-18 LAB — CERVICOVAGINAL ANCILLARY ONLY
Bacterial Vaginitis (gardnerella): NEGATIVE
Candida Glabrata: NEGATIVE
Candida Vaginitis: POSITIVE — AB
Comment: NEGATIVE
Comment: NEGATIVE
Comment: NEGATIVE
Comment: NEGATIVE
Trichomonas: NEGATIVE

## 2022-10-18 MED ORDER — FLUCONAZOLE 150 MG PO TABS: 150.0000 mg | ORAL_TABLET | Freq: Every day | ORAL | 0 refills | Status: DC

## 2022-10-21 LAB — CYTOLOGY - PAP
Chlamydia: NEGATIVE
Comment: NEGATIVE
Comment: NEGATIVE
Comment: NORMAL
Diagnosis: UNDETERMINED — AB
High risk HPV: NEGATIVE
Neisseria Gonorrhea: NEGATIVE

## 2022-11-05 ENCOUNTER — Encounter: Payer: Self-pay | Admitting: Obstetrics

## 2022-11-05 ENCOUNTER — Ambulatory Visit (INDEPENDENT_AMBULATORY_CARE_PROVIDER_SITE_OTHER): Payer: MEDICAID | Admitting: Obstetrics

## 2022-11-05 VITALS — BP 100/70 | Ht 63.0 in | Wt 151.0 lb

## 2022-11-05 DIAGNOSIS — D241 Benign neoplasm of right breast: Secondary | ICD-10-CM | POA: Diagnosis not present

## 2022-11-05 NOTE — Progress Notes (Signed)
    GYNECOLOGY PROGRESS NOTE  Subjective:  PCP: Patient, No Pcp Per  Patient ID: Christina Mcmillan, adult    DOB: 03-Apr-2000, 22 y.o.   MRN: [id number]  HPI  Patient is a 22 y.o. G1P0010  trans-female who presents for right breast mass, does not hurt, thinks it has been there for awhile, but was only noticed yesterday. No FMH of breast cancer. About the size of a large grape, is under the nipple, but moves around. Pt wanted to have it checked out, has high anxiety. Denies nipple discharge, skin changes or retraction and no trauma to the area. Does drink a lot of caffeine.   The following portions of the patient's history were reviewed and updated as appropriate: allergies, current medications, past family history, past medical history, past social history, past surgical history, and problem list.  Review of Systems Pertinent items are noted in HPI.   Objective:   Blood pressure 100/70, height 5\' 3"  (1.6 m), weight 151 lb (68.5 kg). Body mass index is 26.75 kg/m.  General appearance: alert and cooperative Abdomen:  deferred Breast: R breast with a 1cm, nodular, firm, rubbery mobile mass under areola. Bilateral breasts with normal fibrocystic tissue and no other findings.  Pelvic: deferred   Assessment/Plan:   1. Fibroadenoma of breast, right   Reassurance and possible etiologies discussed. Handout in MyChart Symptomatic treatment for pain: Tylenol/Ibuprofen, ice, form-fitting sports bra, avoiding caffeine. Recommend [place] for diagnostic clarity if experiencing changes (new pain, sudden enlargement).  Follow up prn   Julieanne Manson, DO [facility]

## 2022-11-09 ENCOUNTER — Other Ambulatory Visit: Payer: Self-pay

## 2022-11-09 ENCOUNTER — Encounter: Payer: Self-pay | Admitting: *Deleted

## 2022-11-09 ENCOUNTER — Emergency Department
Admission: EM | Admit: 2022-11-09 | Discharge: 2022-11-09 | Disposition: A | Discharge status: Home / Self Care | Payer: MEDICAID | Attending: Emergency Medicine | Admitting: Emergency Medicine

## 2022-11-09 DIAGNOSIS — H66002 Acute suppurative otitis media without spontaneous rupture of ear drum, left ear: Secondary | ICD-10-CM | POA: Diagnosis not present

## 2022-11-09 DIAGNOSIS — H8112 Benign paroxysmal vertigo, left ear: Secondary | ICD-10-CM | POA: Insufficient documentation

## 2022-11-09 DIAGNOSIS — H9202 Otalgia, left ear: Secondary | ICD-10-CM | POA: Diagnosis present

## 2022-11-09 MED ORDER — MECLIZINE HCL 25 MG PO TABS
25.0000 mg | ORAL_TABLET | Freq: Once | ORAL | Status: AC
  Administered 2022-11-09: 25 mg via ORAL
  Filled 2022-11-09: qty 1

## 2022-11-09 MED ORDER — AMOXICILLIN-POT CLAVULANATE 875-125 MG PO TABS: 1.0000 | ORAL_TABLET | Freq: Two times a day (BID) | ORAL | 0 refills | Status: AC

## 2022-11-09 MED ORDER — AMOXICILLIN-POT CLAVULANATE 875-125 MG PO TABS
1.0000 | ORAL_TABLET | Freq: Once | ORAL | Status: AC
  Administered 2022-11-09: 1 via ORAL
  Filled 2022-11-09: qty 1

## 2022-11-09 MED ORDER — MECLIZINE HCL 25 MG PO TABS: 25.0000 mg | ORAL_TABLET | Freq: Three times a day (TID) | ORAL | 0 refills | Status: DC | PRN

## 2022-11-09 NOTE — ED Provider Notes (Signed)
[place] [facility]PARTMENT AT [facility] Provider Note   CSN: [id number] Arrival date & time: 11/09/22  1617     History  Chief Complaint  Patient presents with   Otalgia    Christina Mcmillan is a 22 y.o. adult.  Presents to the emergency department for evaluation of left ear pain for couple of weeks.  She is also had a few days of dizziness.  Has a history of vertigo.  She states when she moves her head while laying down in bed she notices dizziness and feels like the room spinning.  She has been having pressure and pain in the left ear for a few weeks.  She denies any cough congestion runny nose.  No vision changes, numbness tingling radicular symptoms.  No weakness in upper or lower extremities.  No significant dizziness with standing and ambulation only when she is laying down and moves her head to the left she notices the room is spinning.  She has not take any medications for vertigo. HPI     Home Medications Prior to Admission medications   Medication Sig Start Date End Date Taking? Authorizing Provider  amoxicillin-clavulanate (AUGMENTIN) 875-125 MG tablet Take 1 tablet by mouth 2 (two) times daily for 10 days. 11/09/22 11/19/22 Yes Evon Slack, PA-C  meclizine (ANTIVERT) 25 MG tablet Take 1 tablet (25 mg total) by mouth 3 (three) times daily as needed for dizziness. 11/09/22  Yes Evon Slack, PA-C  busPIRone (BUSPAR) 15 MG tablet Take 15 mg by mouth 2 (two) times daily. 10/19/22   [provider]  escitalopram (LEXAPRO) 20 MG tablet Take 20 mg by mouth daily.    [provider]  fluconazole (DIFLUCAN) 150 MG tablet Take 1 tablet (150 mg total) by mouth daily. Patient not taking: Reported on 11/05/2022 10/18/22   Julieanne Manson, MD  FLUoxetine (PROZAC) 40 MG capsule Take 1 capsule by mouth daily. 11/05/17   [provider]  hydrOXYzine (ATARAX/VISTARIL) 25 MG tablet Take 1 tablet (25 mg total) by mouth daily as needed. 12/05/17    Leata Mouse, MD  ibuprofen (ADVIL) 600 MG tablet Take 1 tablet (600 mg total) by mouth every 6 (six) hours as needed. 10/02/22   Tommi Rumps, PA-C  lamoTRIgine (LAMICTAL) 25 MG tablet Take by mouth. 10/18/22   [provider]  OLANZapine (ZYPREXA) 5 MG tablet Take 1 tablet (5 mg total) by mouth at bedtime for 5 days. 02/20/22 02/25/22  Georga Hacking, MD  OLANZapine (ZYPREXA) 5 MG tablet Take 1 tablet (5 mg total) by mouth at bedtime for 7 days. 04/25/22 05/02/22  Poggi, Herb Grays, PA-C  testosterone cypionate (DEPOTESTOSTERONE CYPIONATE) 200 MG/ML injection SMARTSIG:0.3 Milliliter(s) SUB-Q Once a Week    [provider]  Vitamin D, Ergocalciferol, (DRISDOL) 1.25 MG (50000 UT) CAPS capsule     [provider]      Allergies    Fruit & vegetable daily [nutritional supplements]    Review of Systems   Review of Systems  Physical Exam Updated Vital Signs BP 109/63 (BP Location: Left Arm)   Pulse 100   Temp 97.9 F (36.6 C) (Oral)   Resp 18   Ht 5\' 3"  (1.6 m)   Wt 68.5 kg   LMP 10/15/2022   SpO2 96%   BMI 26.75 kg/m  Physical Exam Constitutional:      Appearance: He is well-developed.  HENT:     Head: Normocephalic and atraumatic.     Right Ear:  Tympanic membrane, ear canal and external ear normal.     Ears:     Comments: Left TM bulging, erythematous with fluid present behind the TM.  TM is intact.  Canals normal.    Nose: Nose normal.  Eyes:     Conjunctiva/sclera: Conjunctivae normal.  Cardiovascular:     Rate and Rhythm: Normal rate.  Pulmonary:     Effort: Pulmonary effort is normal. No respiratory distress.  Musculoskeletal:        General: Normal range of motion.     Cervical back: Normal range of motion.  Skin:    General: Skin is warm.     Findings: No rash.  Neurological:     General: No focal deficit present.     Mental Status: He is alert and oriented to person, place, and time. Mental status is at baseline.      Cranial Nerves: No cranial nerve deficit.     Motor: No weakness.     Gait: Gait normal.     Comments: Positive Dix-Hallpike test.  Psychiatric:        Mood and Affect: Mood normal.        Behavior: Behavior normal.        Thought Content: Thought content normal.        Judgment: Judgment normal.     ED Results / Procedures / Treatments   Labs (all labs ordered are listed, but only abnormal results are displayed) Labs Reviewed - No data to display  EKG None  Radiology No results found.  Procedures Procedures    Medications Ordered in ED Medications  meclizine (ANTIVERT) tablet 25 mg (has no administration in time range)  amoxicillin-clavulanate (AUGMENTIN) 875-125 MG per tablet 1 tablet (has no administration in time range)    ED Course/ Medical Decision Making/ A&P                                 Medical Decision Making Risk Prescription drug management.   22 year old female with left ear pain, has signs of otitis media on exam.  She is started on Augmentin.  She also reports some vertigo symptoms that are mild for the last few days but seem to be dependent upon lying down supine and rotation of the head to the left.  She describes the room spinning.  No neurological deficits on exam.  Positive Dix-Hallpike test with nystagmus.  Patient given prescription for meclizine.  She is given strict return precautions.  She understands signs symptoms return to the ER for. Final Clinical Impression(s) / ED Diagnoses Final diagnoses:  Non-recurrent acute suppurative otitis media of left ear without spontaneous rupture of tympanic membrane  Benign paroxysmal positional vertigo of left ear    Rx / DC Orders ED Discharge Orders          Ordered    amoxicillin-clavulanate (AUGMENTIN) 875-125 MG tablet  2 times daily        11/09/22 1728    meclizine (ANTIVERT) 25 MG tablet  3 times daily PRN        11/09/22 1728              Evon Slack, PA-C 11/09/22 1732     Minna Antis, MD 11/09/22 2308

## 2022-11-09 NOTE — ED Triage Notes (Signed)
Pt has left ear ache.  No nasal congestion  no fever.  Pt alert

## 2022-11-09 NOTE — Discharge Instructions (Signed)
Please take medications as prescribed.  Make sure you are drinking lots of fluids.  Return to the ER for any fevers, increasing pain worsening symptoms or any urgent changes in your health

## 2022-11-22 ENCOUNTER — Other Ambulatory Visit (HOSPITAL_COMMUNITY)
Admission: RE | Admit: 2022-11-22 | Discharge: 2022-11-22 | Disposition: A | Discharge status: Home / Self Care | Payer: MEDICAID | Source: Ambulatory Visit | Attending: Obstetrics | Admitting: Obstetrics

## 2022-11-22 ENCOUNTER — Ambulatory Visit (INDEPENDENT_AMBULATORY_CARE_PROVIDER_SITE_OTHER): Payer: MEDICAID | Admitting: Obstetrics

## 2022-11-22 ENCOUNTER — Encounter: Payer: Self-pay | Admitting: Obstetrics

## 2022-11-22 VITALS — BP 100/65 | HR 93 | Ht 63.0 in | Wt 151.0 lb

## 2022-11-22 DIAGNOSIS — N898 Other specified noninflammatory disorders of vagina: Secondary | ICD-10-CM | POA: Insufficient documentation

## 2022-11-22 DIAGNOSIS — B379 Candidiasis, unspecified: Secondary | ICD-10-CM | POA: Diagnosis not present

## 2022-11-22 DIAGNOSIS — B3731 Acute candidiasis of vulva and vagina: Secondary | ICD-10-CM

## 2022-11-22 MED ORDER — FLUCONAZOLE 150 MG PO TABS: 150.0000 mg | ORAL_TABLET | ORAL | 0 refills | Status: DC

## 2022-11-22 NOTE — Progress Notes (Signed)
    GYNECOLOGY PROGRESS NOTE  Subjective:  PCP: Pcp, No  Patient ID: Christina Mcmillan, adult    DOB: 2000/03/17, 22 y.o.   MRN: [id number]  HPI  Patient is a 22 y.o. 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 Was recently on antibiotics for a ear infection, treated through the ED. After taking abx, noticed vaginal itching and white discharge. Has had yeast infections before and this is similar. No STI exposure.   The following portions of the patient's history were reviewed and updated as appropriate: allergies, current medications, past family history, past medical history, past social history, past surgical history, and problem list.  Review of Systems Pertinent items are noted in HPI.   Objective:   Blood pressure 100/65, pulse 93, height 5\' 3"  (1.6 m), weight 151 lb (68.5 kg), last menstrual period 10/15/2022. Body mass index is 26.75 kg/m.  General appearance: alert and cooperative Abdomen: soft, non-tender; bowel sounds normal; no masses,  no organomegaly Pelvic: cervix normal in appearance, external genitalia normal, no cervical motion tenderness, and vagina with thick white discharge, mildly erythematous and tender. Extremities: extremities normal, atraumatic, no cyanosis or edema Neurologic: Grossly normal  Assessment/Plan:   1. Vaginal candidiasis   2. Vaginal itching     Exam c/w yeast infection, Rx for Diflucan x 2.  If develops recurrent, pt may call and similar sx and will send in a one-time 150mg  dose.    Julieanne Manson, DO [facility]

## 2022-11-24 LAB — CERVICOVAGINAL ANCILLARY ONLY
Bacterial Vaginitis (gardnerella): NEGATIVE
Candida Glabrata: NEGATIVE
Candida Vaginitis: POSITIVE — AB
Comment: NEGATIVE
Comment: NEGATIVE
Comment: NEGATIVE

## 2022-12-07 ENCOUNTER — Ambulatory Visit: Payer: MEDICAID | Admitting: Obstetrics

## 2022-12-07 ENCOUNTER — Encounter: Payer: Self-pay | Admitting: Obstetrics

## 2022-12-07 VITALS — BP 100/60 | Ht 63.0 in | Wt 147.0 lb

## 2022-12-07 DIAGNOSIS — Z3202 Encounter for pregnancy test, result negative: Secondary | ICD-10-CM

## 2022-12-07 DIAGNOSIS — N751 Abscess of Bartholin's gland: Secondary | ICD-10-CM

## 2022-12-07 LAB — POCT URINE PREGNANCY: Preg Test, Ur: NEGATIVE

## 2022-12-07 MED ORDER — FLUCONAZOLE 150 MG PO TABS: 150.0000 mg | ORAL_TABLET | ORAL | 0 refills | Status: DC

## 2022-12-07 MED ORDER — SULFAMETHOXAZOLE-TRIMETHOPRIM 800-160 MG PO TABS: 1.0000 | ORAL_TABLET | Freq: Two times a day (BID) | ORAL | 1 refills | Status: DC

## 2022-12-07 NOTE — Progress Notes (Signed)
    GYNECOLOGY PROGRESS NOTE  Subjective:  PCP: Pcp, No  Patient ID: Christina Mcmillan, adult    DOB: Jul 08, 2000, 22 y.o.   MRN: [id number]  HPI  Patient is a 22 y.o. G1P0010 trans-female pt who presents for a cyst on his right labia. He noticed this 11/20, 7 days ago, it gradually became sore and is now improving. Denies drainage, has not taken anything for it. Googled and thinks he has a bartholin's cyst. No prior hx of this before. Feels like the cyst is gradually improving on it's own. Is sexually active with same female partner, no recent STI exposure, no vaginal discharge, burning, itching or odor.   The following portions of the patient's history were reviewed and updated as appropriate: allergies, current medications, past family history, past medical history, past social history, past surgical history, and problem list.  Review of Systems Pertinent items are noted in HPI.   Objective:   Blood pressure 100/60, height 5\' 3"  (1.6 m), weight 147 lb (66.7 kg), last menstrual period 12/05/2022. Body mass index is 26.04 kg/m.  General appearance: alert and cooperative Abdomen: soft, non-tender; bowel sounds normal; no masses,  no organomegaly Pelvic: right labia with a 1cm bartholin's cyst, palpable, mildly tender, no drainage.  Extremities: extremities normal, atraumatic, no cyanosis or edema Neurologic: Grossly normal   Assessment/Plan:   1. Abscess of right Bartholin gland   Discussed I&D vs antibiotics today, due to small size and symptoms self-resolving, feel is candidate for abx therapy. Pt opts for medication.  -Rx for Bactrim DS BID x 7d, dosing and side effects reviewed -Pt prone to yeast infections, sending in a course of Fluconazole for when he has finished the abx -Daily sitz baths and symptomatic treatment at home -Follow up if worsening. Over the holiday weekend, if not responding to abx and is worsening, should seek I&D through ED.    Julieanne Manson, DO [facility]

## 2022-12-13 ENCOUNTER — Encounter: Payer: Self-pay | Admitting: Obstetrics

## 2023-02-24 ENCOUNTER — Encounter: Payer: MEDICAID | Admitting: Podiatry

## 2023-02-24 NOTE — Progress Notes (Signed)
Patient did not show for his scheduled appointment this afternoon

## 2023-02-26 ENCOUNTER — Emergency Department
Admission: EM | Admit: 2023-02-26 | Discharge: 2023-02-26 | Disposition: A | Discharge status: Home / Self Care | Payer: MEDICAID | Attending: Emergency Medicine | Admitting: Emergency Medicine

## 2023-02-26 ENCOUNTER — Other Ambulatory Visit: Payer: Self-pay

## 2023-02-26 DIAGNOSIS — R0981 Nasal congestion: Secondary | ICD-10-CM | POA: Diagnosis present

## 2023-02-26 DIAGNOSIS — J069 Acute upper respiratory infection, unspecified: Secondary | ICD-10-CM | POA: Insufficient documentation

## 2023-02-26 LAB — RESP PANEL BY RT-PCR (RSV, FLU A&B, COVID)  RVPGX2
Influenza A by PCR: NEGATIVE
Influenza B by PCR: NEGATIVE
Resp Syncytial Virus by PCR: NEGATIVE
SARS Coronavirus 2 by RT PCR: NEGATIVE

## 2023-02-26 LAB — GROUP A STREP BY PCR: Group A Strep by PCR: NOT DETECTED

## 2023-02-26 NOTE — ED Triage Notes (Signed)
Pt to ED w partner, both being seen for nasal congestion and sore throat. Pt symptomatic since this morning. Pt in NAD. He/him pronouns.

## 2023-02-26 NOTE — ED Provider Notes (Signed)
   [facility] Provider Note    Event Date/Time   First MD Initiated Contact with Patient 02/26/23 1922     (approximate)   History   Nasal Congestion and Sore Throat   HPI  Christina Mcmillan is a 23 y.o. adult who presents for evaluation of nasal congestion and sore this morning.  Patient's partner has the same symptoms.      Physical Exam   Triage Vital Signs: ED Triage Vitals  Encounter Vitals Group     BP 02/26/23 1826 107/61     Systolic BP Percentile --      Diastolic BP Percentile --      Pulse Rate 02/26/23 1826 (!) 117     Resp 02/26/23 1826 18     Temp 02/26/23 1826 98.5 F (36.9 C)     Temp Source 02/26/23 1826 Oral     SpO2 02/26/23 1826 95 %     Weight 02/26/23 1828 155 lb (70.3 kg)     Height 02/26/23 1828 5\' 3"  (1.6 m)     Head Circumference --      Peak Flow --      Pain Score 02/26/23 1825 0     Pain Loc --      Pain Education --      Exclude from Growth Chart --     Most recent vital signs: Vitals:   02/26/23 1826  BP: 107/61  Pulse: (!) 117  Resp: 18  Temp: 98.5 F (36.9 C)  SpO2: 95%    General: Awake, no distress.  CV:  Good peripheral perfusion.  RRR. Resp:  Normal effort.  CTAB. Abd:  No distention.    ED Results / Procedures / Treatments   Labs (all labs ordered are listed, but only abnormal results are displayed) Labs Reviewed  GROUP A STREP BY PCR  RESP PANEL BY RT-PCR (RSV, FLU A&B, COVID)  [id number]    PROCEDURES:  Critical Care performed: No  Procedures   MEDICATIONS ORDERED IN ED: Medications - No data to display   IMPRESSION / MDM / ASSESSMENT AND PLAN / ED COURSE  I reviewed the triage vital signs and the nursing notes.                             23 year old who presents for evaluation of nasal congestion and sore throat.  Patient was tachycardic otherwise vital signs are stable.  Patient NAD and nontoxic-appearing on exam.  Differential diagnosis includes, but is not limited to, flu,  COVID, RSV, bronchitis, pneumonia, strep throat, viral URI.  Patient's presentation is most consistent with acute, uncomplicated illness.  Respiratory panel and strep test were both negative.  I discussed symptomatic management using Tylenol, ibuprofen and over-the-counter cold medications.  Encouraged drinking lots of fluids and rest.  They voiced understanding, all questions were answered and they were stable at discharge.     FINAL CLINICAL IMPRESSION(S) / ED DIAGNOSES   Final diagnoses:  Viral upper respiratory tract infection     Rx / DC Orders   ED Discharge Orders     None        Note:  This document was prepared using Dragon voice recognition software and may include unintentional dictation errors.   Cameron Ali, PA-C 02/26/23 2022    Trinna Post, MD 02/27/23 0003

## 2023-02-26 NOTE — Discharge Instructions (Signed)
You most likely have a viral upper respiratory infection.  This will resolve on its own with time.  You can take Tylenol and ibuprofen as needed for fever and bodyaches.  I also recommend over-the-counter cold medication.  Drink lots of fluids and get lots of rest.

## 2023-02-28 ENCOUNTER — Ambulatory Visit: Payer: MEDICAID | Admitting: Podiatry

## 2023-03-03 ENCOUNTER — Other Ambulatory Visit: Payer: Self-pay

## 2023-03-03 ENCOUNTER — Emergency Department
Admission: EM | Admit: 2023-03-03 | Discharge: 2023-03-03 | Disposition: A | Discharge status: Home / Self Care | Payer: MEDICAID | Attending: Emergency Medicine | Admitting: Emergency Medicine

## 2023-03-03 DIAGNOSIS — H9202 Otalgia, left ear: Secondary | ICD-10-CM | POA: Diagnosis present

## 2023-03-03 DIAGNOSIS — H6692 Otitis media, unspecified, left ear: Secondary | ICD-10-CM | POA: Insufficient documentation

## 2023-03-03 DIAGNOSIS — H6690 Otitis media, unspecified, unspecified ear: Secondary | ICD-10-CM

## 2023-03-03 MED ORDER — AMOXICILLIN-POT CLAVULANATE 875-125 MG PO TABS: 1.0000 | ORAL_TABLET | Freq: Two times a day (BID) | ORAL | 0 refills | Status: AC

## 2023-03-03 MED ORDER — IBUPROFEN 600 MG PO TABS
600.0000 mg | ORAL_TABLET | Freq: Once | ORAL | Status: AC
  Administered 2023-03-03: 600 mg via ORAL
  Filled 2023-03-03: qty 1

## 2023-03-03 NOTE — Discharge Instructions (Addendum)
You are seen in the emergency department and diagnosed with an ear infection in your left ear.  You are given a prescription for an antibiotic.  You can alternate Motrin and Tylenol.  You are given a referral for primary care physician, the number for primary care doctors and the number for the ear nose and throat specialist.  Pain control:  Ibuprofen (motrin/aleve/advil) - You can take 3 tablets (600 mg) every 6 hours as needed for pain/fever.  Acetaminophen (tylenol) - You can take 2 extra strength tablets (1000 mg) every 6 hours as needed for pain/fever.  You can alternate these medications or take them together.  Make sure you eat food/drink water when taking these medications.  Thank you for choosing [place] for your health care, it was my pleasure to care for you today!  Corena Herter, MD

## 2023-03-03 NOTE — ED Triage Notes (Signed)
Pt sts that he is here for ear fullness and pain. Pt sts that he was dx with the flu on 2/15 and since than the pain has gotten worse and he thinks that he might have an ear infection.

## 2023-03-03 NOTE — ED Provider Notes (Signed)
[facility] Provider Note    Event Date/Time   First MD Initiated Contact with Patient 03/03/23 1658     (approximate)   History   Ear Fullness   HPI  Christina Mcmillan is a 23 y.o. adult [place] [employer] with left ear pain.  States that she has been having left ear pain for the past day.  She was recently diagnosed with influenza on 2/15 and states that since that time she has had significantly worsening pain to her left ear.  Recent acute otitis media.  States that she has not been evaluated by ENT in the past.  Does not currently have a primary care provider.  Denies any shortness of breath, chest pain, cough, abdominal pain, nausea, vomiting or diarrhea.  Denies concern for pregnancy.   Physical Exam   Triage Vital Signs: ED Triage Vitals [03/03/23 1532]  Encounter Vitals Group     BP 106/77     Systolic BP Percentile      Diastolic BP Percentile      Pulse Rate (!) 114     Resp 17     Temp 98.5 F (36.9 C)     Temp Source Oral     SpO2 96 %     Weight 155 lb (70.3 kg)     Height      Head Circumference      Peak Flow      Pain Score 0     Pain Loc      Pain Education      Exclude from Growth Chart     Most recent vital signs: Vitals:   03/03/23 1532  BP: 106/77  Pulse: (!) 114  Resp: 17  Temp: 98.5 F (36.9 C)  SpO2: 96%    Physical Exam Constitutional:      Appearance: He is well-developed.  HENT:     Head: Atraumatic.     Right Ear: Tympanic membrane normal.     Left Ear: Swelling and tenderness present. A middle ear effusion is present. Tympanic membrane is injected. Tympanic membrane is not perforated.     Ears:     Comments: No tenderness to the mastoid.  No fullness or pain. Eyes:     Conjunctiva/sclera: Conjunctivae normal.  Cardiovascular:     Rate and Rhythm: Regular rhythm.  Pulmonary:     Effort: No respiratory distress.  Musculoskeletal:     Cervical back: Normal range of motion.  Skin:    General: Skin is  warm.  Neurological:     Mental Status: He is alert. Mental status is at baseline.      IMPRESSION / MDM / ASSESSMENT AND PLAN / ED COURSE  I reviewed the triage vital signs and the nursing notes.  Differential diagnosis including otitis externa, acute otitis media, viral illness including COVID/influenza   Labs (all labs ordered are listed, but only abnormal results are displayed) Labs interpreted as -    Labs Reviewed - No data to display  Lab work overall reassuring patient did have tachycardia on arrival.  On exam patient with findings concerning for left-sided acute otitis media likely secondary to her recent viral illness.  Will start the patient on Augmentin.  No signs of mastoiditis.  Given information to establish care with primary care physician, given recurrent nature given information to follow-up with the ear nose and throat specialist.  Given return precautions for any ongoing symptoms.     PROCEDURES:  Critical Care performed: No  Procedures  Patient's presentation is most consistent with acute complicated illness / injury requiring diagnostic workup.   MEDICATIONS ORDERED IN ED: Medications  ibuprofen (ADVIL) tablet 600 mg (600 mg Oral Given 03/03/23 1728)    FINAL CLINICAL IMPRESSION(S) / ED DIAGNOSES   Final diagnoses:  Recurrent AOM (acute otitis media)     Rx / DC Orders   ED Discharge Orders          Ordered    Ambulatory Referral to Primary Care (Establish Care)        03/03/23 1718    amoxicillin-clavulanate (AUGMENTIN) 875-125 MG tablet  2 times daily        03/03/23 1719             Note:  This document was prepared using Dragon voice recognition software and may include unintentional dictation errors.   Corena Herter, MD 03/03/23 2051

## 2023-03-14 ENCOUNTER — Ambulatory Visit (INDEPENDENT_AMBULATORY_CARE_PROVIDER_SITE_OTHER): Payer: MEDICAID | Admitting: Podiatry

## 2023-03-14 ENCOUNTER — Encounter: Payer: Self-pay | Admitting: Podiatry

## 2023-03-14 DIAGNOSIS — L600 Ingrowing nail: Secondary | ICD-10-CM | POA: Diagnosis not present

## 2023-03-14 MED ORDER — NEOMYCIN-POLYMYXIN-HC 3.5-10000-1 OT SUSP: OTIC | 0 refills | Status: DC

## 2023-03-14 NOTE — Patient Instructions (Signed)

## 2023-03-15 ENCOUNTER — Encounter: Payer: Self-pay | Admitting: Podiatry

## 2023-03-15 NOTE — Progress Notes (Signed)
  Subjective:  Patient ID: Christina Mcmillan, adult    DOB: [birth year],  MRN: [id number]  Chief Complaint  Patient presents with   Nail Problem    "I have a pretty bad ingrown toenail.  Dr. Allena Katz did the ingrown toenail before but it grew back."    23 y.o. adult presents with the above complaint. History confirmed with patient.  There has been complete regrowth of the nail and has been infected and bothering him for several weeks now  Objective:  Physical Exam: warm, good capillary refill, no trophic changes or ulcerative lesions, normal DP and PT pulses, normal sensory exam, and left great toenail ingrown with paronychia  Assessment:   1. Ingrown left big toenail      Plan:  Patient was evaluated and treated and all questions answered.     Ingrown Nail, left -Patient elects to proceed with minor surgery to remove ingrown toenail today. Consent reviewed and signed by patient. -Ingrown nail excised. See procedure note. -Educated on post-procedure care including soaking. Written instructions provided and reviewed. -Rx for Cortisporin sent to pharmacy. -Advised on signs and symptoms of infection developing.  We discussed that the phenol likely will create some redness and edema and tenderness around the nailbed as long as it is localized this is to be expected.  Will return as needed if any infection signs develop  Procedure: Excision of Ingrown Toenail Location: Left 1st toe lateral nail borders. Anesthesia: Lidocaine 1% plain; 1.5 mL and Marcaine 0.5% plain; 1.5 mL, digital block. Skin Prep: Betadine. Dressing: Silvadene; telfa; dry, sterile, compression dressing. Technique: Following skin prep, the toe was exsanguinated and a tourniquet was secured at the base of the toe. The affected nail border was freed, split with a nail splitter, and excised. Chemical matrixectomy was then performed with phenol and irrigated out with alcohol. The tourniquet was then removed and sterile dressing  applied. Disposition: Patient tolerated procedure well.    No follow-ups on file.

## 2023-03-21 ENCOUNTER — Other Ambulatory Visit: Payer: Self-pay

## 2023-03-21 ENCOUNTER — Emergency Department
Admission: EM | Admit: 2023-03-21 | Discharge: 2023-03-21 | Disposition: A | Discharge status: Home / Self Care | Payer: MEDICAID | Attending: Emergency Medicine | Admitting: Emergency Medicine

## 2023-03-21 DIAGNOSIS — L02411 Cutaneous abscess of right axilla: Secondary | ICD-10-CM | POA: Insufficient documentation

## 2023-03-21 DIAGNOSIS — M7989 Other specified soft tissue disorders: Secondary | ICD-10-CM | POA: Diagnosis present

## 2023-03-21 MED ORDER — HYDROCODONE-ACETAMINOPHEN 5-325 MG PO TABS
1.0000 | ORAL_TABLET | Freq: Once | ORAL | Status: AC
  Administered 2023-03-21: 1 via ORAL
  Filled 2023-03-21: qty 1

## 2023-03-21 MED ORDER — DOXYCYCLINE HYCLATE 100 MG PO TABS: 100.0000 mg | ORAL_TABLET | Freq: Two times a day (BID) | ORAL | 0 refills | Status: AC

## 2023-03-21 MED ORDER — LIDOCAINE HCL (PF) 1 % IJ SOLN
5.0000 mL | Freq: Once | INTRAMUSCULAR | Status: AC
  Administered 2023-03-21: 5 mL
  Filled 2023-03-21: qty 5

## 2023-03-21 NOTE — ED Notes (Signed)
 See triage note. Approx 2cm raised erythematous lesion in right axilla. Lesion appears to have come to a head but no drainage evident at this time. Pt denies any drainage. No previous Hx of similar symptoms. Pt took OTC ibuprofen this morning for pain.

## 2023-03-21 NOTE — ED Provider Triage Note (Signed)
 Emergency Medicine Provider Triage Evaluation Note  Christina Mcmillan , a 23 y.o. adult  was evaluated in triage.  Pt complains of an abscess to the R armpit. No fevers. No other complaint.  Review of Systems  Positive: Abscess to R armpit Negative: Fever, chills  Physical Exam  There were no vitals taken for this visit. Gen:   Awake, no distress   Resp:  Normal effort  MSK:   Moves extremities without difficulty  Other:    Medical Decision Making  Medically screening exam initiated at 5:06 PM.  Appropriate orders placed.  Christina Mcmillan was informed that the remainder of the evaluation will be completed by another provider, this initial triage assessment does not replace that evaluation, and the importance of remaining in the ED until their evaluation is complete.  No labs or imaging currently   Racheal Patches, PA-C 03/21/23 1709

## 2023-03-21 NOTE — Discharge Instructions (Addendum)
 You are seen in the ER today for evaluation of your armpit swelling.  You did have an abscess on exam that we drained.  I sent a prescription for antibiotics to your pharmacy.  Please take these as directed.  Follow with your primary care doctor for further evaluation.  Return to the ER for new or worsening symptoms.

## 2023-03-21 NOTE — ED Triage Notes (Signed)
 Pt comes with c/o 4-5 days of abscess noted to under right arm. Pt states it looks big and infected.

## 2023-03-21 NOTE — ED Provider Notes (Signed)
 [facility] Provider Note    Event Date/Time   First MD Initiated Contact with Patient 03/21/23 1946     (approximate)   History   Abscess   HPI  Christina Mcmillan is a 23 year old presenting to the emergency department for evaluation of armpit swelling.  About 4 days ago began to notice swelling in their axillary area with associated pain.  No history of similar.  No fevers.  Tried stabbing the area to open it, but no significant drainage.     Physical Exam   Triage Vital Signs: ED Triage Vitals  Encounter Vitals Group     BP 03/21/23 1708 114/66     Systolic BP Percentile --      Diastolic BP Percentile --      Pulse Rate 03/21/23 1708 93     Resp 03/21/23 1708 18     Temp 03/21/23 1708 98 F (36.7 C)     Temp src --      SpO2 03/21/23 1708 100 %     Weight 03/21/23 1707 155 lb (70.3 kg)     Height 03/21/23 1707 5\' 3"  (1.6 m)     Head Circumference --      Peak Flow --      Pain Score 03/21/23 1707 6     Pain Loc --      Pain Education --      Exclude from Growth Chart --     Most recent vital signs: Vitals:   03/21/23 1708  BP: 114/66  Pulse: 93  Resp: 18  Temp: 98 F (36.7 C)  SpO2: 100%     General: Awake, interactive  CV:  Regular rate, good peripheral perfusion.  Resp:  Unlabored respirations.  Abd:  Nondistended.  Neuro:  Symmetric facial movement, fluid speech MSK:  There is an area of fluctuant swelling over the right axillary region with overlying erythema.  Scattered additional small pustules noted without significant induration   ED Results / Procedures / Treatments   Labs (all labs ordered are listed, but only abnormal results are displayed) Labs Reviewed - No data to display   EKG EKG independently reviewed interpreted by myself (ER attending) demonstrates:    RADIOLOGY Imaging independently reviewed and interpreted by myself demonstrates:    PROCEDURES:  Critical Care performed: No  .Incision and  Drainage  Date/Time: 03/21/2023 9:06 PM  Performed by: Trinna Post, MD Authorized by: Trinna Post, MD   Consent:    Consent obtained:  Verbal   Consent given by:  Patient   Risks, benefits, and alternatives were discussed: yes   Location:    Type:  Abscess   Location:  Trunk   Trunk location: R axilla. Pre-procedure details:    Skin preparation:  Chlorhexidine Sedation:    Sedation type:  None Anesthesia:    Anesthesia method:  Local infiltration   Local anesthetic:  Lidocaine 1% w/o epi Procedure type:    Complexity:  Simple Procedure details:    Incision types:  Single straight   Wound management:  Probed and deloculated   Drainage:  Purulent   Wound treatment:  Wound left open Post-procedure details:    Procedure completion:  Tolerated    MEDICATIONS ORDERED IN ED: Medications  lidocaine (PF) (XYLOCAINE) 1 % injection 5 mL (has no administration in time range)     IMPRESSION / MDM / ASSESSMENT AND PLAN / ED COURSE  I reviewed the triage vital signs and the nursing notes.  Differential diagnosis includes, but is not limited to, abscess, cellulitis, lower suspicion lymph node given superficial and fluctuant nature  Patient's presentation is most consistent with acute, uncomplicated illness.  23 year old presenting to the emergency department with right axillary swelling and pain.  Exam consistent with abscess with overlying cellulitis.  I&D performed as above.  With scattered pustules in the area, will DC on antibiotics.  Strict return precautions provided.  Patient discharged stable condition.      FINAL CLINICAL IMPRESSION(S) / ED DIAGNOSES   Final diagnoses:  Abscess of axilla, right     Rx / DC Orders   ED Discharge Orders          Ordered    doxycycline (VIBRA-TABS) 100 MG tablet  2 times daily        03/21/23 2108             Note:  This document was prepared using Dragon voice recognition software and may include unintentional dictation  errors.   Trinna Post, MD 03/21/23 2108

## 2023-05-02 ENCOUNTER — Ambulatory Visit
Admission: EM | Admit: 2023-05-02 | Discharge: 2023-05-02 | Disposition: A | Discharge status: Home / Self Care | Payer: MEDICAID

## 2023-05-02 ENCOUNTER — Encounter: Payer: Self-pay | Admitting: Emergency Medicine

## 2023-05-02 DIAGNOSIS — L0201 Cutaneous abscess of face: Secondary | ICD-10-CM

## 2023-05-02 MED ORDER — MUPIROCIN 2 % EX OINT: 1.0000 | TOPICAL_OINTMENT | Freq: Three times a day (TID) | CUTANEOUS | 0 refills | Status: AC

## 2023-05-02 MED ORDER — DOXYCYCLINE HYCLATE 100 MG PO CAPS: 100.0000 mg | ORAL_CAPSULE | Freq: Two times a day (BID) | ORAL | 0 refills | Status: AC

## 2023-05-02 NOTE — ED Provider Notes (Signed)
 [facility]    CSN: [id number] Arrival date & time: 05/02/23  1152      History   Chief Complaint Chief Complaint  Patient presents with   Bump in Nostril     HPI Christina Mcmillan is a 23 y.o. adult transgender female.  Presents today for a 3 to 4-day history of an area of swelling, redness and pain inside the right nostril.  Patient says that he pushed on the area and some pus did come out.  Reports that he has been scratching inside his nose and his piercings have been scraping the inside of his nostril.  Works at [employer] and thinks he could have contaminated the area somehow that way.  Denies having any abscesses in his nose before and no known history of MRSA or staph infection but was seen in the ER about a month ago for abscess of the axilla which was treated with doxycycline  and got better.  No associated fever, congestion or other concerns.  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   MDD (major depressive disorder), severe (HCC) 11/30/2017   Drug overdose 11/29/2017   Hypotension 11/29/2017   Vitamin D  deficiency 12/02/2015   Severe episode of recurrent major depressive disorder, without psychotic features (HCC) 11/26/2015   Social anxiety disorder 11/26/2015    Past Surgical History:  Procedure Laterality Date   TYMPANOSTOMY TUBE PLACEMENT      OB History     Gravida  1   Para      Term      Preterm      AB  1   Living         SAB      IAB      Ectopic      Multiple      Live Births               Home Medications    Prior to  Admission medications   Medication Sig Start Date End Date Taking? Authorizing Provider  benztropine (COGENTIN) 0.5 MG tablet Take 0.5 mg by mouth daily. 01/13/23  Yes [provider]  busPIRone (BUSPAR) 15 MG tablet Take 15 mg by mouth 2 (two) times daily. 10/19/22   [provider]  doxycycline  (VIBRAMYCIN ) 100 MG capsule Take 1 capsule (100 mg total) by mouth 2 (two) times daily for 7 days. 05/02/23 05/09/23 Yes Nancy Axon B, PA-C  escitalopram (LEXAPRO) 20 MG tablet Take 20 mg by mouth daily.    [provider]  finasteride (PROPECIA) 1 MG tablet Take 1 mg by mouth daily. 11/19/22   [provider]  fluconazole  (DIFLUCAN ) 150 MG tablet Take 1 tablet (150 mg total) by mouth every 3 (three) days. 12/07/22   Sofia Dunn, MD  FLUoxetine  (PROZAC ) 40 MG capsule Take 1 capsule by mouth daily. 11/05/17   [provider]  hydrOXYzine  (ATARAX /VISTARIL ) 25 MG tablet Take 1 tablet (25 mg total) by mouth daily as needed. 12/05/17   Jonnalagadda, Janardhana, MD  ibuprofen  (ADVIL ) 600 MG tablet Take 1 tablet (600 mg total) by  mouth every 6 (six) hours as needed. 10/02/22   Stafford Eagles, PA-C  lamoTRIgine (LAMICTAL) 25 MG tablet Take by mouth. 10/18/22   [provider]  meclizine  (ANTIVERT ) 25 MG tablet Take 1 tablet (25 mg total) by mouth 3 (three) times daily as needed for dizziness. 11/09/22   Coralyn Derry, PA-C  mupirocin  ointment (BACTROBAN ) 2 % Apply 1 Application topically 3 (three) times daily for 7 days. 05/02/23 05/09/23 Yes Floydene Hy, PA-C  norethindrone (MICRONOR) 0.35 MG tablet Take 1 tablet by mouth daily.    [provider]  OLANZapine  (ZYPREXA ) 5 MG tablet Take 1 tablet (5 mg total) by mouth at bedtime for 5 days. 02/20/22 02/25/22  Heather Litter, MD  OLANZapine  (ZYPREXA ) 5 MG tablet Take 1 tablet (5 mg total) by mouth at bedtime for 7 days. 04/25/22 05/02/22  Poggi, Jenna E, PA-C  testosterone cypionate (DEPOTESTOSTERONE  CYPIONATE) 200 MG/ML injection SMARTSIG:0.3 Milliliter(s) SUB-Q Once a Week    [provider]  Vitamin D , Ergocalciferol , (DRISDOL ) 1.25 MG (50000 UT) CAPS capsule     [provider]    Family History Family History  Problem Relation Age of Onset   Diabetes Father    Diabetes Brother    Hypertension Other     Social History Social History   Tobacco Use   Smoking status: Former    Types: Cigarettes   Smokeless tobacco: Former   Tobacco comments:    Vape daily, no cigarettes. DJM 03/14/23  Vaping Use   Vaping status: Every Day  Substance Use Topics   Alcohol use: Not Currently    Comment: denies regular use but reports it the other night   Drug use: Not Currently    Types: Benzodiazepines    Comment: reports use of alcohol with xanax 1 time the other night     Allergies   Bactrim  [sulfamethoxazole -trimethoprim ], Gabapentin, and Fruit & vegetable daily [nutritional supplements]   Review of Systems Review of Systems  Constitutional:  Negative for fatigue and fever.  HENT:  Negative for congestion and rhinorrhea.        Nasal pain, swelling, redness inside nostril  Skin:  Positive for color change.  Neurological:  Negative for weakness and headaches.     Physical Exam Triage Vital Signs ED Triage Vitals  Encounter Vitals Group     BP      Systolic BP Percentile      Diastolic BP Percentile      Pulse      Resp      Temp      Temp src      SpO2      Weight      Height      Head Circumference      Peak Flow      Pain Score      Pain Loc      Pain Education      Exclude from Growth Chart    No data found.  Updated Vital Signs BP 114/76 (BP Location: Right Arm)   Pulse 97   Temp 98 F (36.7 C) (Oral)   Resp 18   SpO2 96%      Physical Exam Vitals and nursing note reviewed.  Constitutional:      General: He is not in acute distress.    Appearance: Normal appearance. He is not ill-appearing or toxic-appearing.  HENT:      Head: Normocephalic and atraumatic.     Nose: No  congestion or rhinorrhea.     Comments: Patient has numerous facial and nasal piercings. Has a large septum horseshoe gauge present. There is a moderate sized abscess inside the right nostril which is very tender to palpation. Scant yellowish drainage.  Eyes:     General: No scleral icterus.       Right eye: No discharge.        Left eye: No discharge.     Conjunctiva/sclera: Conjunctivae normal.  Cardiovascular:     Rate and Rhythm: Normal rate.  Pulmonary:     Effort: Pulmonary effort is normal. No respiratory distress.  Musculoskeletal:     Cervical back: Neck supple.  Skin:    General: Skin is dry.  Neurological:     General: No focal deficit present.     Mental Status: He is alert. Mental status is at baseline.     Motor: No weakness.     Gait: Gait normal.  Psychiatric:        Mood and Affect: Mood normal.        Behavior: Behavior normal.      UC Treatments / Results  Labs (all labs ordered are listed, but only abnormal results are displayed) Labs Reviewed - No data to display  EKG   Radiology No results found.  Procedures Procedures (including critical care time)  Medications Ordered in UC Medications - No data to display  Initial Impression / Assessment and Plan / UC Course  I have reviewed the triage vital signs and the nursing notes.  Pertinent labs & imaging results that were available during my care of the patient were reviewed by me and considered in my medical decision making (see chart for details).   23 year old transgender female presents for an area of redness, swelling and pain inside the right nostril for the past few days.  History of abscess treated in the ED last month.  Has numerous facial piercings and a very large horseshoe septum piercing present.  No new piercings and patient cleans jewelry regularly.  On exam has an abscess inside the right nostril.  Will treat at this time with doxycycline   and mupirocin  ointment.  Also discussed cleaning area with saline spray and monitoring closely.  Advised if no improvement in the next couple of days or symptoms worsen to go to emergency department or return here.  Discussed good hand hygiene, not picking at nose and cleaning jewelry regularly.   Final Clinical Impressions(s) / UC Diagnoses   Final diagnoses:  Abscess of face     Discharge Instructions      - Clean area with saline spray and try to leave the septum to jewelry out until this gets better. - Take full course of antibiotics and also use the antibiotic ointment applied to area with use of a Q-tip. -If fever, worsening of symptoms or no improvement in 2 to 3 days please return for reevaluation.  Please go to ER for sudden acute and severe worsening symptoms.     ED Prescriptions     Medication Sig Dispense Auth. Provider   doxycycline  (VIBRAMYCIN ) 100 MG capsule Take 1 capsule (100 mg total) by mouth 2 (two) times daily for 7 days. 14 capsule Nancy Axon B, PA-C   mupirocin  ointment (BACTROBAN ) 2 % Apply 1 Application topically 3 (three) times daily for 7 days. 22 g Floydene Hy, PA-C      PDMP not reviewed this encounter.   Floydene Hy, PA-C 05/02/23 1319

## 2023-05-02 NOTE — ED Triage Notes (Signed)
 Pt presents with a bump inside his right nostril for 3-4 days. He popped it and now it draining and swollen.

## 2023-05-02 NOTE — Discharge Instructions (Addendum)
-   Clean area with saline spray and try to leave the septum to jewelry out until this gets better. - Take full course of antibiotics and also use the antibiotic ointment applied to area with use of a Q-tip. -If fever, worsening of symptoms or no improvement in 2 to 3 days please return for reevaluation.  Please go to ER for sudden acute and severe worsening symptoms.

## 2023-05-16 ENCOUNTER — Ambulatory Visit: Payer: MEDICAID | Admitting: Internal Medicine

## 2023-05-16 ENCOUNTER — Telehealth: Payer: Self-pay

## 2023-05-16 NOTE — Telephone Encounter (Signed)
 Copied from CRM [id number]. Topic: General - Running Late >> May 16, 2023  2:14 PM Jeris Montes S wrote: Patient/patient representative is calling because they are running late for an appointment.

## 2023-07-25 ENCOUNTER — Other Ambulatory Visit: Payer: Self-pay

## 2023-07-25 ENCOUNTER — Ambulatory Visit: Payer: MEDICAID | Admitting: Internal Medicine

## 2023-07-25 ENCOUNTER — Encounter: Payer: Self-pay | Admitting: Internal Medicine

## 2023-07-25 VITALS — BP 100/68 | HR 97 | Temp 98.1°F | Resp 18 | Ht 63.0 in | Wt 144.1 lb

## 2023-07-25 DIAGNOSIS — Z23 Encounter for immunization: Secondary | ICD-10-CM | POA: Diagnosis not present

## 2023-07-25 DIAGNOSIS — E559 Vitamin D deficiency, unspecified: Secondary | ICD-10-CM

## 2023-07-25 DIAGNOSIS — Z1159 Encounter for screening for other viral diseases: Secondary | ICD-10-CM

## 2023-07-25 DIAGNOSIS — F640 Transsexualism: Secondary | ICD-10-CM | POA: Diagnosis not present

## 2023-07-25 DIAGNOSIS — F332 Major depressive disorder, recurrent severe without psychotic features: Secondary | ICD-10-CM | POA: Diagnosis not present

## 2023-07-25 DIAGNOSIS — R350 Frequency of micturition: Secondary | ICD-10-CM

## 2023-07-25 DIAGNOSIS — Z79899 Other long term (current) drug therapy: Secondary | ICD-10-CM | POA: Diagnosis not present

## 2023-07-25 DIAGNOSIS — Z3041 Encounter for surveillance of contraceptive pills: Secondary | ICD-10-CM | POA: Diagnosis not present

## 2023-07-25 DIAGNOSIS — Z114 Encounter for screening for human immunodeficiency virus [HIV]: Secondary | ICD-10-CM

## 2023-07-25 DIAGNOSIS — N3000 Acute cystitis without hematuria: Secondary | ICD-10-CM

## 2023-07-25 LAB — POCT URINALYSIS DIPSTICK
Bilirubin, UA: NEGATIVE
Glucose, UA: NEGATIVE
Ketones, UA: NEGATIVE
Nitrite, UA: POSITIVE
Protein, UA: POSITIVE — AB
Spec Grav, UA: 1.02 (ref 1.010–1.025)
Urobilinogen, UA: 0.2 U/dL
pH, UA: 5 (ref 5.0–8.0)

## 2023-07-25 MED ORDER — NORETHINDRONE 0.35 MG PO TABS: 1.0000 | ORAL_TABLET | Freq: Every day | ORAL | 3 refills | Status: DC

## 2023-07-25 MED ORDER — NITROFURANTOIN MONOHYD MACRO 100 MG PO CAPS: 100.0000 mg | ORAL_CAPSULE | Freq: Two times a day (BID) | ORAL | 0 refills | Status: AC
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
## 2023-07-27 LAB — URINE CULTURE
MICRO NUMBER:: 16695578
SPECIMEN QUALITY:: ADEQUATE

## 2023-07-27 LAB — COMPREHENSIVE METABOLIC PANEL WITH GFR
AG Ratio: 2.7 (calc) — ABNORMAL HIGH (ref 1.0–2.5)
ALT: 44 U/L — ABNORMAL HIGH (ref 6–29)
AST: 25 U/L (ref 10–30)
Albumin: 4.9 g/dL (ref 3.6–5.1)
Alkaline phosphatase (APISO): 66 U/L (ref 31–125)
BUN: 9 mg/dL (ref 7–25)
CO2: 28 mmol/L (ref 20–32)
Calcium: 9.4 mg/dL (ref 8.6–10.2)
Chloride: 104 mmol/L (ref 98–110)
Creat: 0.73 mg/dL (ref 0.50–0.96)
Globulin: 1.8 g/dL — ABNORMAL LOW (ref 1.9–3.7)
Glucose, Bld: 86 mg/dL (ref 65–99)
Potassium: 4.8 mmol/L (ref 3.5–5.3)
Sodium: 140 mmol/L (ref 135–146)
Total Bilirubin: 0.7 mg/dL (ref 0.2–1.2)
Total Protein: 6.7 g/dL (ref 6.1–8.1)
eGFR: 118 mL/min/1.73m2 (ref >=60)

## 2023-07-27 LAB — CBC WITH DIFFERENTIAL/PLATELET
Absolute Lymphocytes: 1875 {cells}/uL (ref 850–3900)
Absolute Monocytes: 378 {cells}/uL (ref 200–950)
Basophils Absolute: 30 {cells}/uL (ref 0–200)
Basophils Relative: 0.7 %
Eosinophils Absolute: 60 {cells}/uL (ref 15–500)
Eosinophils Relative: 1.4 %
HCT: 44.3 % (ref 35.0–45.0)
Hemoglobin: 14.8 g/dL (ref 11.7–15.5)
MCH: 30.1 pg (ref 27.0–33.0)
MCHC: 33.4 g/dL (ref 32.0–36.0)
MCV: 90 fL (ref 80.0–100.0)
MPV: 10.7 fL (ref 7.5–12.5)
Monocytes Relative: 8.8 %
Neutro Abs: 1957 {cells}/uL (ref 1500–7800)
Neutrophils Relative %: 45.5 %
Platelets: 268 Thousand/uL (ref 140–400)
RBC: 4.92 Million/uL (ref 3.80–5.10)
RDW: 11.3 % (ref 11.0–15.0)
Total Lymphocyte: 43.6 %
WBC: 4.3 Thousand/uL (ref 3.8–10.8)

## 2023-07-27 LAB — VITAMIN D 25 HYDROXY (VIT D DEFICIENCY, FRACTURES): Vit D, 25-Hydroxy: 28 ng/mL — ABNORMAL LOW (ref 30–100)

## 2023-07-27 LAB — HIV ANTIBODY (ROUTINE TESTING W REFLEX): HIV 1&2 Ab, 4th Generation: NONREACTIVE

## 2023-07-27 LAB — HEPATITIS C ANTIBODY: Hepatitis C Ab: NONREACTIVE

## 2023-07-28 ENCOUNTER — Ambulatory Visit: Payer: Self-pay | Admitting: Internal Medicine

## 2023-07-28 DIAGNOSIS — E559 Vitamin D deficiency, unspecified: Secondary | ICD-10-CM

## 2023-07-28 MED ORDER — VITAMIN D (ERGOCALCIFEROL) 1.25 MG (50000 UNIT) PO CAPS: 50000.0000 [IU] | ORAL_CAPSULE | ORAL | 0 refills | Status: DC

## 2023-07-29 ENCOUNTER — Ambulatory Visit: Payer: MEDICAID | Admitting: Internal Medicine

## 2023-08-24 ENCOUNTER — Ambulatory Visit: Payer: MEDICAID | Admitting: Internal Medicine

## 2023-08-24 ENCOUNTER — Other Ambulatory Visit: Payer: Self-pay

## 2023-08-24 ENCOUNTER — Encounter: Payer: Self-pay | Admitting: Internal Medicine

## 2023-08-24 VITALS — BP 112/68 | HR 102 | Temp 97.8°F | Resp 16 | Ht 63.0 in | Wt 141.1 lb

## 2023-08-24 DIAGNOSIS — E559 Vitamin D deficiency, unspecified: Secondary | ICD-10-CM

## 2023-08-24 DIAGNOSIS — Z3041 Encounter for surveillance of contraceptive pills: Secondary | ICD-10-CM | POA: Diagnosis not present

## 2023-08-24 MED ORDER — VITAMIN D (ERGOCALCIFEROL) 1.25 MG (50000 UNIT) PO CAPS: 50000.0000 [IU] | ORAL_CAPSULE | ORAL | 0 refills | Status: AC

## 2023-08-24 MED ORDER — NORETHINDRONE 0.35 MG PO TABS: 1.0000 | ORAL_TABLET | Freq: Every day | ORAL | 3 refills | Status: AC

## 2023-08-24 NOTE — Patient Instructions (Signed)
 It was great seeing you today!  Plan discussed at today's visit: -Recommend Zyrtec and Astelin (Astropro) nasal spray over the counter for allergies   Follow up in: 1 year  Take care and let us  know if you have any questions or concerns prior to your next visit.  Dr. Bernardo
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
## 2023-08-30 ENCOUNTER — Ambulatory Visit (INDEPENDENT_AMBULATORY_CARE_PROVIDER_SITE_OTHER): Payer: MEDICAID

## 2023-08-30 DIAGNOSIS — Z23 Encounter for immunization: Secondary | ICD-10-CM | POA: Diagnosis not present

## 2023-08-30 NOTE — Progress Notes (Signed)
 Patient is in office today for a nurse visit for HPV Immunization. Patient Injection was given in the  Left deltoid. Patient tolerated injection well.

## 2023-09-13 ENCOUNTER — Other Ambulatory Visit: Payer: Self-pay

## 2023-09-13 ENCOUNTER — Emergency Department
Admission: EM | Admit: 2023-09-13 | Discharge: 2023-09-13 | Disposition: A | Discharge status: Home / Self Care | Payer: MEDICAID | Attending: Emergency Medicine | Admitting: Emergency Medicine

## 2023-09-13 DIAGNOSIS — L03011 Cellulitis of right finger: Secondary | ICD-10-CM | POA: Diagnosis not present

## 2023-09-13 DIAGNOSIS — M79644 Pain in right finger(s): Secondary | ICD-10-CM | POA: Diagnosis present

## 2023-09-13 NOTE — ED Provider Notes (Signed)
 [facility] Provider Note    Event Date/Time   First MD Initiated Contact with Patient 09/13/23 0932     (approximate)   History   Finger Injury   HPI  Christina Mcmillan is a 23 y.o. adult who presents today for evaluation of right fourth finger pain.  Patient reports that patient bites her nails and feels like it might be infected.  No numbness or tingling.  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/30/2017   MDD (major depressive disorder), severe (HCC) 11/30/2017   Drug overdose 11/29/2017   Hypotension 11/29/2017   Vitamin D  deficiency 12/02/2015   Severe episode of recurrent major depressive disorder, without psychotic features (HCC) 11/26/2015   Social anxiety disorder 11/26/2015          Physical Exam   Triage Vital Signs: ED Triage Vitals  Encounter Vitals Group     BP 09/13/23 0929 108/76     Girls Systolic BP Percentile --      Girls Diastolic BP Percentile --      Boys Systolic BP Percentile --      Boys Diastolic BP Percentile --      Pulse Rate 09/13/23 0929 (!) 101     Resp 09/13/23 0929 18     Temp 09/13/23 0929 97.6 F (36.4 C)     Temp src --      SpO2 09/13/23 0929 100 %     Weight 09/13/23 0927 150 lb (68 kg)     Height 09/13/23 0927 5' 2 (1.575 m)     Head Circumference --      Peak Flow --      Pain Score 09/13/23 0927 8     Pain Loc --      Pain Education --      Exclude from Growth Chart --     Most recent vital signs: Vitals:   09/13/23 0929  BP: 108/76  Pulse: (!) 101  Resp: 18  Temp: 97.6 F (36.4 C)  SpO2: 100%    Physical Exam Vitals and nursing note reviewed.  Constitutional:      General: Awake and alert. No acute distress.    Appearance: Normal appearance. The patient is normal weight.  HENT:     Head: Normocephalic and  atraumatic.     Mouth: Mucous membranes are moist.  Eyes:     General: PERRL. Normal EOMs        Right eye: No discharge.        Left eye: No discharge.     Conjunctiva/sclera: Conjunctivae normal.  Cardiovascular:     Rate and Rhythm: Normal rate and regular rhythm.     Pulses: Normal pulses.  Pulmonary:     Effort: Pulmonary effort is normal. No respiratory distress.     Breath sounds: Normal breath sounds.  Abdominal:     Abdomen is soft. There is no abdominal tenderness. No rebound or guarding. No distention. Musculoskeletal:        General: No swelling. Normal range of motion.     Cervical back: Normal range of motion and neck supple.  Right fourth finger with erythema, swelling, and tenderness along the nail line.  No tenderness to pad of finger.  Able to flex and extend at isolated PIP and DIP against resistance.  No subungual hematoma  or abscess. Skin:    General: Skin is warm and dry.     Capillary Refill: Capillary refill takes less than 2 seconds.     Findings: No rash.  Neurological:     Mental Status: The patient is awake and alert.      ED Results / Procedures / Treatments   Labs (all labs ordered are listed, but only abnormal results are displayed) Labs Reviewed - No data to display   EKG     RADIOLOGY     PROCEDURES:  Critical Care performed:   .Incision and Drainage  Date/Time: 09/13/2023 9:39 AM  Performed by: Dedra Matsuo E, PA-C Authorized by: Jamone Garrido E, PA-C   Consent:    Consent obtained:  Verbal   Consent given by:  Patient   Risks, benefits, and alternatives were discussed: yes     Risks discussed:  Bleeding, damage to other organs, incomplete drainage, infection and pain   Alternatives discussed:  No treatment Universal protocol:    Procedure explained and questions answered to patient or proxy's satisfaction: yes     Relevant documents present and verified: yes     Test results available : yes     Required blood products,  implants, devices, and special equipment available: yes     Site/side marked: yes     Patient identity confirmed:  Verbally with patient Location:    Type:  Abscess   Location:  Upper extremity   Upper extremity location:  Finger   Finger location:  R ring finger Pre-procedure details:    Skin preparation:  Antiseptic wash Sedation:    Sedation type:  None Anesthesia:    Anesthesia method:  None Procedure type:    Complexity:  Simple Procedure details:    Ultrasound guidance: no     Needle aspiration: no     Incision types:  Stab incision (Lifted cuticle)   Incision depth:  Dermal   Wound management:  Probed and deloculated   Drainage:  Purulent   Drainage amount:  Scant   Wound treatment:  Wound left open   Packing materials:  None Post-procedure details:    Procedure completion:  Tolerated well, no immediate complications    MEDICATIONS ORDERED IN ED: Medications - No data to display   IMPRESSION / MDM / ASSESSMENT AND PLAN / ED COURSE  I reviewed the triage vital signs and the nursing notes.   Differential diagnosis includes, but is not limited to, paronychia, felon, cellulitis  Patient is awake and alert, hemodynamically stable and afebrile.  He has an obvious paronychia to the fourth ring finger, likely from chewing nails.  Recommended incision and drainage of this area to release the paronychia.  Patient is in agreement.  Cuticle was lifted with purulence expressed and immediate improvement of pain.  No pad of finger tenderness or evidence of felon.  No tenderness along the flexor tendon sheath, no fusiform swelling, not holding hand in passive flexion, no pain with passive extension, no signs of flexor tenosynovitis.  We discussed the importance of stopping chewing his nails to avoid recurrence in the future.  Also discussed wound care.  We discussed return precautions and outpatient follow-up.  Patient or stands and agrees with plan.  Discharged in stable  condition   Patient's presentation is most consistent with acute complicated illness / injury requiring diagnostic workup.      FINAL CLINICAL IMPRESSION(S) / ED DIAGNOSES   Final diagnoses:  Paronychia of finger of right hand  Rx / DC Orders   ED Discharge Orders     None        Note:  This document was prepared using Dragon voice recognition software and may include unintentional dictation errors.   Iva Montelongo E, PA-C 09/13/23 1107    Jacolyn Pae, MD 09/13/23 [phone]

## 2023-09-13 NOTE — Discharge Instructions (Addendum)
 Keep your hands clean, wash often with soap and water.  Please stop by the urinalysis this was likely the reason for your infection today.  Please return for any new, worsening, or change in symptoms or other concerns.  It was a pleasure caring for you today

## 2023-09-13 NOTE — ED Triage Notes (Signed)
 Pt comes with c/o right ring finger pain. Pt has redness and swelling noted. Pt states biting of nails and thinks it might be infected. Pt states noticed it yesterday and it has gotten worse since.

## 2023-10-14 ENCOUNTER — Encounter: Payer: Self-pay | Admitting: Internal Medicine

## 2023-12-02 ENCOUNTER — Encounter: Payer: Self-pay | Admitting: Internal Medicine

## 2023-12-19 ENCOUNTER — Encounter: Payer: Self-pay | Admitting: Internal Medicine

## 2023-12-19 ENCOUNTER — Ambulatory Visit: Payer: MEDICAID | Admitting: Internal Medicine

## 2023-12-19 ENCOUNTER — Other Ambulatory Visit: Payer: Self-pay

## 2023-12-19 VITALS — BP 120/72 | HR 100 | Temp 98.5°F | Resp 18 | Ht 63.0 in | Wt 134.5 lb

## 2023-12-19 DIAGNOSIS — R10813 Right lower quadrant abdominal tenderness: Secondary | ICD-10-CM

## 2023-12-19 NOTE — Progress Notes (Signed)
 Acute Office Visit  Subjective:     Patient ID: Christina Mcmillan, adult    DOB: 02-22-2000, 23 y.o.   MRN: [id number]  Chief Complaint  Patient presents with   Hernia    HPI Patient is in today for hernia evaluation.   Discussed the use of AI scribe software for clinical note transcription with the patient, who gave verbal consent to proceed.  History of Present Illness Christina Mcmillan is a 23 year old female who presents with concerns about a possible hernia.  He was diagnosed with a groin hernia in 2022, likely inguinal. Symptoms occur mainly when bearing down, such as during bowel movements, with a sensation of something moving or poking through and gradual bloating on the affected side. He has intermittent sharp pain with stretching or standing, and a small hump appears every few days. Swelling is minimal, there is no obvious protrusion he can reduce, and direct pressure on the area is painful. Occasional pain is present on the opposite side.  He is on testosterone therapy with very light menstrual periods.  He previously used marijuana heavily and noticed crackling in his lungs. He has reduced his use because of concern about lung health.   Review of Systems  Gastrointestinal:  Positive for abdominal pain. Negative for nausea and vomiting.        Objective:    BP 120/72 (Cuff Size: Large)   Pulse 100   Temp 98.5 F (36.9 C) (Oral)   Resp 18   Ht 5' 3 (1.6 m)   Wt 134 lb 8 oz (61 kg)   SpO2 97%   BMI 23.83 kg/m  BP Readings from Last 3 Encounters:  12/19/23 120/72  09/13/23 108/76  08/24/23 112/68   Wt Readings from Last 3 Encounters:  12/19/23 134 lb 8 oz (61 kg)  09/13/23 150 lb (68 kg)  08/24/23 141 lb 1.6 oz (64 kg)      Physical Exam Constitutional:      Appearance: Normal appearance.  HENT:     Head: Normocephalic and atraumatic.  Eyes:     Conjunctiva/sclera: Conjunctivae normal.  Cardiovascular:     Rate and Rhythm: Normal rate and regular  rhythm.  Pulmonary:     Effort: Pulmonary effort is normal.     Breath sounds: Normal breath sounds.  Abdominal:     General: Bowel sounds are normal. There is distension.     Palpations: Abdomen is soft.     Tenderness: There is abdominal tenderness. There is no guarding or rebound.     Comments: RLQ soft tissue swelling with tenderness, no inguinal hernia palpated   Skin:    General: Skin is warm and dry.  Neurological:     General: No focal deficit present.     Mental Status: He is alert. Mental status is at baseline.  Psychiatric:        Mood and Affect: Mood normal.        Behavior: Behavior normal.     No results found for any visits on 12/19/23.      Assessment & Plan:   Assessment & Plan Abdominal wall hernia Possible direct abdominal wall hernia with bloating and pain. Differential includes inguinal hernia and subcutaneous cysts. Low ischemic risk due to lack of reducible mass. Financial constraints noted. - Ordered abdominal ultrasound to evaluate hernia. - Advised resolving insurance issues to reduce costs. - Provided contact for ultrasound scheduling.   - US  Abdomen Complete; Future   Return  if symptoms worsen or fail to improve.  Sharyle Fischer, DO

## 2024-01-11 ENCOUNTER — Other Ambulatory Visit: Payer: Self-pay

## 2024-01-11 ENCOUNTER — Encounter: Payer: Self-pay | Admitting: Intensive Care

## 2024-01-11 ENCOUNTER — Emergency Department
Admission: EM | Admit: 2024-01-11 | Discharge: 2024-01-11 | Disposition: A | Discharge status: Home / Self Care | Payer: MEDICAID

## 2024-01-11 DIAGNOSIS — L0231 Cutaneous abscess of buttock: Secondary | ICD-10-CM | POA: Diagnosis not present

## 2024-01-11 DIAGNOSIS — R2241 Localized swelling, mass and lump, right lower limb: Secondary | ICD-10-CM | POA: Diagnosis present

## 2024-01-11 MED ORDER — HYDROCODONE-ACETAMINOPHEN 5-325 MG PO TABS
1.0000 | ORAL_TABLET | Freq: Once | ORAL | Status: AC
  Administered 2024-01-11: 1 via ORAL
  Filled 2024-01-11: qty 1

## 2024-01-11 MED ORDER — IBUPROFEN 800 MG PO TABS
800.0000 mg | ORAL_TABLET | Freq: Once | ORAL | Status: AC
  Administered 2024-01-11: 800 mg via ORAL
  Filled 2024-01-11: qty 1

## 2024-01-11 MED ORDER — DOXYCYCLINE HYCLATE 100 MG PO TABS: 100.0000 mg | ORAL_TABLET | Freq: Two times a day (BID) | ORAL | 0 refills | Status: AC

## 2024-01-11 MED ORDER — DOXYCYCLINE HYCLATE 100 MG PO TABS
100.0000 mg | ORAL_TABLET | Freq: Once | ORAL | Status: AC
  Administered 2024-01-11: 100 mg via ORAL
  Filled 2024-01-11: qty 1

## 2024-01-11 NOTE — ED Provider Notes (Signed)
 "   [facility] Emergency Department Provider Note     Event Date/Time   First MD Initiated Contact with Patient 01/11/24 1048     (approximate)   History   Abscess   HPI  Christina Mcmillan is a 23 y.o. adult presents to the ED for evaluation of a small bump to his right lower buttock region that has increased in size x 3 days.  He denies any drainage from the area.  Endorses severe pain with sitting and walking.  No fever or chills.  Patient reports they have had similar bumps in the past to their axillary region and the bumps continue to return.   Physical Exam   Triage Vital Signs: ED Triage Vitals  Encounter Vitals Group     BP 01/11/24 1043 121/79     Girls Systolic BP Percentile --      Girls Diastolic BP Percentile --      Boys Systolic BP Percentile --      Boys Diastolic BP Percentile --      Pulse Rate 01/11/24 1043 80     Resp 01/11/24 1043 16     Temp 01/11/24 1043 98.2 F (36.8 C)     Temp Source 01/11/24 1043 Oral     SpO2 01/11/24 1043 100 %     Weight 01/11/24 1041 136 lb (61.7 kg)     Height 01/11/24 1041 5' 3 (1.6 m)     Head Circumference --      Peak Flow --      Pain Score 01/11/24 1041 5     Pain Loc --      Pain Education --      Exclude from Growth Chart --     Most recent vital signs: Vitals:   01/11/24 1043 01/11/24 1241  BP: 121/79 118/74  Pulse: 80 84  Resp: 16 18  Temp: 98.2 F (36.8 C)   SpO2: 100% 100%    General Awake, no distress.  HEENT NCAT.  CV:  Good peripheral perfusion.  RESP:  Normal effort.  ABD:  No distention.  Other:  1 cm pustule with surrounding induration overlying approximately 5 cm erythema to the lower aspect of right inner buttock.  No perianal or perineal involvement.   ED Results / Procedures / Treatments   Labs (all labs ordered are listed, but only abnormal results are displayed) Labs Reviewed - No data to display  No results found.  PROCEDURES:  Critical Care performed:  No  .Incision and Drainage  Date/Time: 01/11/2024 12:56 PM  Performed by: Margrette Rebbeca LABOR, PA-C Authorized by: Margrette Rebbeca LABOR, PA-C   Consent:    Consent obtained:  Verbal   Consent given by:  Patient   Risks, benefits, and alternatives were discussed: yes     Risks discussed:  Bleeding, incomplete drainage, pain, infection and damage to other organs Location:    Type:  Abscess   Size:  1   Location:  Lower extremity   Lower extremity location:  Buttock   Buttock location:  R buttock Pre-procedure details:    Skin preparation:  Povidone-iodine Anesthesia:    Anesthesia method:  None (Patient declined) Procedure type:    Complexity:  Simple Procedure details:    Incision depth:  Dermal   Wound management:  Probed and deloculated and irrigated with saline (Minimal deloculation)   Drainage:  Purulent   Wound treatment:  Wound left open   Packing materials:  None Post-procedure details:  Procedure completion:  Tolerated    MEDICATIONS ORDERED IN ED: Medications  ibuprofen  (ADVIL ) tablet 800 mg (800 mg Oral Given 01/11/24 1201)  doxycycline  (VIBRA -TABS) tablet 100 mg (100 mg Oral Given 01/11/24 1201)  HYDROcodone -acetaminophen  (NORCO/VICODIN) 5-325 MG per tablet 1 tablet (1 tablet Oral Given 01/11/24 1224)     IMPRESSION / MDM / ASSESSMENT AND PLAN / ED COURSE  I reviewed the triage vital signs and the nursing notes.                               23 y.o. adult presents to the emergency department for evaluation and treatment of right buttock bump and pain. See HPI for further details.   Differential diagnosis includes, but is not limited to abscess, cellulitis, rash  Patient's presentation is most consistent with acute, uncomplicated illness.  Presentation is consistent with an abscess to the right buttock region overlying cellulitis.  I&D performed please see procedure note.  Patient tolerated well.  Patient provided with doxycycline  given allergy to Bactrim ,  ibuprofen  and Norco for pain while in the ED.  Patient would not like a prescription for narcotics.  Discussed alternation between Tylenol  and NSAIDs for pain control as well as sits baths and which she verbalized understanding.  Prescription for doxycycline  sent to pharmacy.  ED return precautions were discussed including spreading of redness to perineal region, fever, and increased pain.  The patient is in stable and satisfactory condition for discharge home. All questions and concerns were addressed during this ED visit.     FINAL CLINICAL IMPRESSION(S) / ED DIAGNOSES   Final diagnoses:  Abscess of buttock, right   Rx / DC Orders   ED Discharge Orders          Ordered    doxycycline  (VIBRA -TABS) 100 MG tablet  2 times daily        01/11/24 1227           Note:  This document was prepared using Dragon voice recognition software and may include unintentional dictation errors.    Margrette Rebbeca LABOR, PA-C 01/11/24 1300    Nicholaus Rolland BRAVO, MD 01/11/24 [phone]  "

## 2024-01-11 NOTE — Discharge Instructions (Addendum)
 You were evaluated in the ED for a skin abscess with incision & drainage located to your right buttock.  You have been prescribed antibiotics.  Please take as instructed until dose is complete even if symptoms improve.  As discussed monitor the area for worsening signs of infection.  If you experience any fever, spread of redness, or increased pain to the area please return to the ED for further evaluation.  Keep the area clean with mild soap and water and keep it dry. Change your dressing  (band aid) daily or more often if it becomes soaked with drainage. Wash your hands before and after touching the wound or changing the dressing.   Perform sitz bath's 3 times daily.  You can also apply warm compress to the affected area to help with pain.  Pain control:  Ibuprofen  (motrin /aleve/advil ) - You can take 3 tablets (600 mg) every 6 hours as needed for pain.  Acetaminophen  (tylenol ) - You can take 2 extra strength tablets (1000 mg) every 6 hours as needed for pain.  You can alternate these medications or take them together.  Make sure you eat food/drink water when taking these medications.

## 2024-01-11 NOTE — ED Triage Notes (Signed)
 C/o right buttocks abscess X3 days. Denies drainage.

## 2024-01-13 ENCOUNTER — Ambulatory Visit: Admission: RE | Admit: 2024-01-13 | Payer: MEDICAID | Source: Ambulatory Visit

## 2024-01-17 ENCOUNTER — Ambulatory Visit
Admission: RE | Admit: 2024-01-17 | Discharge: 2024-01-17 | Disposition: A | Discharge status: Home / Self Care | Payer: MEDICAID | Source: Ambulatory Visit | Attending: Internal Medicine | Admitting: Internal Medicine

## 2024-01-17 DIAGNOSIS — R10813 Right lower quadrant abdominal tenderness: Secondary | ICD-10-CM

## 2024-01-18 ENCOUNTER — Ambulatory Visit: Payer: Self-pay | Admitting: Internal Medicine

## 2024-01-26 ENCOUNTER — Ambulatory Visit: Payer: Self-pay | Admitting: Internal Medicine

## 2024-02-17 ENCOUNTER — Other Ambulatory Visit: Payer: Self-pay

## 2024-08-24 ENCOUNTER — Ambulatory Visit: Payer: MEDICAID | Admitting: Internal Medicine
